# Patient Record
Sex: Female | Born: 1950 | Race: Black or African American | Hispanic: No | Marital: Single | Smoking: Never smoker
Health system: Southern US, Community
[De-identification: ages and names within clinical notes are randomized; demographics above are authoritative.]

## PROBLEM LIST (undated history)

## (undated) DIAGNOSIS — G809 Cerebral palsy, unspecified: Secondary | ICD-10-CM

## (undated) DIAGNOSIS — I639 Cerebral infarction, unspecified: Secondary | ICD-10-CM

---

## 2006-11-26 ENCOUNTER — Ambulatory Visit: Payer: Self-pay | Admitting: Family Medicine

## 2008-08-23 ENCOUNTER — Encounter: Admission: RE | Admit: 2008-08-23 | Discharge: 2008-08-23 | Payer: Self-pay | Admitting: Family Medicine

## 2008-08-27 ENCOUNTER — Emergency Department (HOSPITAL_COMMUNITY): Admission: EM | Admit: 2008-08-27 | Discharge: 2008-08-27 | Payer: Self-pay | Admitting: Emergency Medicine
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
## 2010-06-02 ENCOUNTER — Encounter: Payer: Self-pay | Admitting: Family Medicine

## 2010-06-03 ENCOUNTER — Encounter: Payer: Self-pay | Admitting: Family Medicine

## 2010-08-01 ENCOUNTER — Ambulatory Visit (INDEPENDENT_AMBULATORY_CARE_PROVIDER_SITE_OTHER): Payer: Medicare Other

## 2010-08-01 ENCOUNTER — Inpatient Hospital Stay (INDEPENDENT_AMBULATORY_CARE_PROVIDER_SITE_OTHER)
Admission: RE | Admit: 2010-08-01 | Discharge: 2010-08-01 | Disposition: A | Discharge status: Home / Self Care | Payer: Medicare Other | Source: Ambulatory Visit | Attending: Family Medicine | Admitting: Family Medicine

## 2010-08-01 DIAGNOSIS — S82843A Displaced bimalleolar fracture of unspecified lower leg, initial encounter for closed fracture: Secondary | ICD-10-CM

## 2010-08-20 ENCOUNTER — Ambulatory Visit (HOSPITAL_COMMUNITY): Payer: Medicare Other

## 2010-08-20 ENCOUNTER — Inpatient Hospital Stay (HOSPITAL_COMMUNITY)
Admission: RE | Admit: 2010-08-20 | Discharge: 2010-08-21 | DRG: 493 | Disposition: A | Discharge status: Home / Self Care | Payer: Medicare Other | Source: Ambulatory Visit | Attending: Orthopedic Surgery | Admitting: Orthopedic Surgery

## 2010-08-20 DIAGNOSIS — R4701 Aphasia: Secondary | ICD-10-CM | POA: Diagnosis present

## 2010-08-20 DIAGNOSIS — X58XXXA Exposure to other specified factors, initial encounter: Secondary | ICD-10-CM | POA: Diagnosis present

## 2010-08-20 DIAGNOSIS — IMO0002 Reserved for concepts with insufficient information to code with codable children: Secondary | ICD-10-CM | POA: Diagnosis present

## 2010-08-20 DIAGNOSIS — S82843A Displaced bimalleolar fracture of unspecified lower leg, initial encounter for closed fracture: Principal | ICD-10-CM | POA: Diagnosis present

## 2010-08-20 DIAGNOSIS — F0390 Unspecified dementia without behavioral disturbance: Secondary | ICD-10-CM | POA: Diagnosis present

## 2010-08-20 DIAGNOSIS — Z23 Encounter for immunization: Secondary | ICD-10-CM

## 2010-08-20 DIAGNOSIS — Z7982 Long term (current) use of aspirin: Secondary | ICD-10-CM

## 2010-08-20 DIAGNOSIS — F79 Unspecified intellectual disabilities: Secondary | ICD-10-CM | POA: Diagnosis present

## 2010-08-20 DIAGNOSIS — Z993 Dependence on wheelchair: Secondary | ICD-10-CM

## 2010-08-20 DIAGNOSIS — Z8673 Personal history of transient ischemic attack (TIA), and cerebral infarction without residual deficits: Secondary | ICD-10-CM

## 2010-08-20 DIAGNOSIS — G809 Cerebral palsy, unspecified: Secondary | ICD-10-CM | POA: Diagnosis present

## 2010-08-20 DIAGNOSIS — I739 Peripheral vascular disease, unspecified: Secondary | ICD-10-CM | POA: Diagnosis present

## 2010-08-20 LAB — BASIC METABOLIC PANEL
BUN: 21 mg/dL (ref 6–23)
Creatinine, Ser: 0.75 mg/dL (ref 0.4–1.2)
GFR calc Af Amer: >60 mL/min (ref >60)
GFR calc non Af Amer: >60 mL/min (ref >60)
Potassium: 4.5 mEq/L (ref 3.5–5.1)

## 2010-08-20 LAB — TYPE AND SCREEN: Antibody Screen: NEGATIVE

## 2010-08-20 LAB — CBC
HCT: 44.2 % (ref 36.0–46.0)
Hemoglobin: 14 g/dL (ref 12.0–15.0)
MCH: 28.5 pg (ref 26.0–34.0)
RBC: 4.91 MIL/uL (ref 3.87–5.11)

## 2010-08-20 LAB — PROTIME-INR: Prothrombin Time: 14.2 seconds (ref 11.6–15.2)

## 2010-08-20 LAB — SURGICAL PCR SCREEN: Staphylococcus aureus: NEGATIVE

## 2010-08-21 LAB — CBC
MCH: 28.6 pg (ref 26.0–34.0)
MCV: 91.3 fL (ref 78.0–100.0)
Platelets: 173 10*3/uL (ref 150–400)
RDW: 13 % (ref 11.5–15.5)
WBC: 11.6 10*3/uL — ABNORMAL HIGH (ref 4.0–10.5)

## 2010-08-21 LAB — BASIC METABOLIC PANEL
CO2: 25 mEq/L (ref 19–32)
Glucose, Bld: 122 mg/dL — ABNORMAL HIGH (ref 70–99)

## 2010-08-23 NOTE — Op Note (Signed)
Cheyenne Marsh, Cheyenne Marsh               ACCOUNT NO.:  [id number]  MEDICAL RECORD NO.:  [id number]           PATIENT TYPE:  I  LOCATION:  5005                         FACILITY:  [facility]  PHYSICIAN:  Eulas Post, MD    DATE OF BIRTH:  1950/09/30  DATE OF PROCEDURE:  08/20/2010 DATE OF DISCHARGE:                              OPERATIVE REPORT   ATTENDING SURGEON:  Eulas Post, MD  FIRST ASSISTANT:  Janace Litten, OPA  PREOPERATIVE DIAGNOSIS:  Chronic left bimalleolar ankle fracture- dislocation.  POSTOPERATIVE DIAGNOSIS:  Chronic left bimalleolar ankle fracture- dislocation.  OPERATIVE PROCEDURE:  Open reduction and internal fixation left bimalleolar ankle fracture, malunion.  ANESTHESIA:  General.  ESTIMATED BLOOD LOSS:  Minimal.  TOURNIQUET TIME:  Approximately 90 minutes.  PREOPERATIVE INDICATIONS:  Cheyenne Marsh is a 60 year old woman who has cerebral palsy and has had a stroke who is on Plavix and aspirin who is normally marginally ambulatory, able to transfer and walk around the house with assistance and a walker, who had an ankle injury that was unrecognized for a period of about 4 weeks.  When she was finally brought for evaluation it was determined that she had an ankle fracture dislocation with already evidence for malunion of the distal fibula and medial malleolus.  I had a long discussion with Cheyenne Marsh's sister, who is her primary caretaker, although has never had a power of attorney, regarding the options.  Her sister has been taking care of her, and keeping her alive from the standpoint of daily management for decades, and given that there were no other family involved, I discussed the risks, benefits and alternatives with her.  Ultimately, a two physician consent was required because she has not received legal power of attorney yet, and the court date to appoint this power was not available for another month, which would have greatly complicated and  reduced the likelihood of success for surgical intervention.  Therefore, we proceeded with two physician consent.  The risks, benefits and alternatives were discussed including but not limited to risks of infection, bleeding, nerve injury, recurrent fracture dislocation, malunion, nonunion, hardware prominence, hardware failure, skin breakdown, the potential for amputation, post-traumatic arthritis, inability to regain ambulatory function, cardiopulmonary complications, among others and they were willing to proceed.  OPERATIVE PROCEDURE:  The patient was brought to the operating room and placed in supine position.  IV antibiotics were given.  General anesthesia administered.  Left lower extremity was prepped and draped in usual sterile fashion.  The leg was elevated, exsanguinated and the tourniquet was inflated.  Incision was made over the distal fibula. Dissection was carried down to the fracture site.  There was abundant callus formation and union.  This was taken down with osteotomes and curettes.  Once I had this mobilized, I still could not adequately reduce the fracture, and so I turned my attention to the medial malleolus.  Incision was made over the medial malleolus and dissection carried down to the fracture site.  The fracture was cleaned and hypertrophic callus removed.  Once I had exposed the fracture and mobilize it, then I was able to  reduce the talus back underneath the tibia.  I then maintained the talus under the tibia and reduced the fibula anatomically.  I held it provisionally with a K-wire.  The bone quality was remarkably poor.  This was not good enough to support an interfragmentary lag screw.  Therefore, with a K-wire in place, I applied a Stryker locking plate and secured it proximally with nonlocking screws and distally.  Initially, I placed a nonlocking screw to compress and help push the lateral segment of the fibula more medial, but once I had confirmed  that I had my length and my anatomic reduction achieved.  After I locked the other holes, I changed the nonlocking screw out for a locking screw.  Therefore, I had four locking screws in the distal segment, and three nonlocking screws proximally.  Excellent fixation was achieved, although bone quality was amazingly poor.  I then turned my attention to the medial malleolus.  This was reduced as anatomically as possible.  This was a fairly challenging reduction, and the bone quality was terrible.  Nonetheless, I did place two cannulated screws, although they were fairly close to each other, and I did not feel secure with fixation.  There was still some mobility at the medial segment.  Therefore, I placed a third screw more anteriorly and in a slightly different plane in order to provide additional fixation. Satisfactory fixation was achieved, and I did consider a syndesmotic screw, although there was not a syndesmotic injury based on testing intraoperatively.  Additionally, syndesmotic screw would have been proximal to the fracture line on the fibula, and therefore not provide any additional fixation that the proximal screws had already provided.  I also considered placing a calcaneal Taylor Steinmann pin for extra fixation, however, I was concerned given the patient's mental function, and what will end up being very challenging to limit her weightbearing, that placement of this pin might increase her risk for infection, as well as the fact that she walks on it there could be substantial pin migration, and make the recovery much more challenging, and potentially need for further surgical intervention for hardware removal.  For these reasons, I elected not to place a calcaneal Taylor tibial pin, however, should this operation fail, she will most certainly need a manual type of fixation with or without external fixation.  Nonetheless, I was able to achieve overall restoration of the mortise  and get the talus aligned underneath the tibia and then I irrigated the wounds copiously and repaired the tissue with Vicryl, followed by Monocryl and Steri-Strips and sterile gauze.  She was awakened and the tourniquet released and she returned to the PACU in stable and satisfactory condition.  There were no complications.  She tolerated the procedure well.  Janace Litten, orthopedic PA-C was present and scrubbed throughout the case and critical for completion both with assistance of reduction as well as exposure and closure.  This was an extremely challenging case, with a high risk for failure, but hopefully we will be able to regain some degree of at least standing and pivoting function, if not ambulatory function.     Eulas Post, MD     JPL/MEDQ  D:  08/20/2010  T:  08/21/2010  Job:  [id number]  Electronically Signed by Teryl Lucy MD on 08/23/2010 07:49:10 PM

## 2010-08-23 NOTE — Discharge Summary (Signed)
  [name], [name]               ACCOUNT NO.:  [id number]  MEDICAL RECORD NO.:  [id number]           PATIENT TYPE:  I  LOCATION:  5005                         FACILITY:  [facility]  PHYSICIAN:  Eulas Post, MD    DATE OF BIRTH:  1950-10-26  DATE OF ADMISSION:  08/20/2010 DATE OF DISCHARGE:  08/21/2010                              DISCHARGE SUMMARY   ADMISSION DIAGNOSES: 1. left ankle fracture dislocation malunion. 2. Mental retardation/cerebral palsy.  DISCHARGE DIAGNOSES: 1. left ankle fracture dislocation malunion. 2. Mental retardation/cerebral palsy.  PRIMARY PROCEDURE:  Open reduction and internal fixation left ankle fracture dislocation malunion.  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  She does ambulate however, and had an ankle fracture dislocation that was subacutely diagnosed.  Her caretaker elected to have her undergo open reduction and internal fixation with the goals of regaining ambulatory function.  She tolerated the procedure well and postoperatively did not have any complications.  She continued on her aspirin and Plavix per her routine protocol and we basically operated through this.  She was given sequential compression devices as well as the aspirin and Plavix for blood thinning capacity.  She was working with Physical Therapy and is nonweightbearing on the left lower extremity keeping it elevated while in bed.  She is planned to be discharged home with follow-up with me in 2 weeks.  There was no complication and she benefited maximally from her hospital stay.  She is also being discharged on hydrocodone.     Eulas Post, MD     JPL/MEDQ  D:  08/21/2010  T:  08/21/2010  Job:  [id number]  Electronically Signed by Teryl Lucy MD on 08/23/2010 07:49:13 PM

## 2011-06-05 DIAGNOSIS — M25579 Pain in unspecified ankle and joints of unspecified foot: Secondary | ICD-10-CM | POA: Diagnosis not present

## 2011-06-05 DIAGNOSIS — R413 Other amnesia: Secondary | ICD-10-CM | POA: Diagnosis not present

## 2011-06-05 DIAGNOSIS — I6789 Other cerebrovascular disease: Secondary | ICD-10-CM | POA: Diagnosis not present

## 2011-06-05 DIAGNOSIS — F0150 Vascular dementia without behavioral disturbance: Secondary | ICD-10-CM | POA: Diagnosis not present

## 2011-12-30 DIAGNOSIS — I6789 Other cerebrovascular disease: Secondary | ICD-10-CM | POA: Diagnosis not present

## 2011-12-30 DIAGNOSIS — F0150 Vascular dementia without behavioral disturbance: Secondary | ICD-10-CM | POA: Diagnosis not present

## 2012-03-22 IMAGING — CR DG CHEST 1V
1 series · 1 of 1 positions shown · non-contrast
Comparison: None.

CLINICAL DATA: Cough, hypertension, CAD.

CHEST - 1 VIEW

[view not recorded]
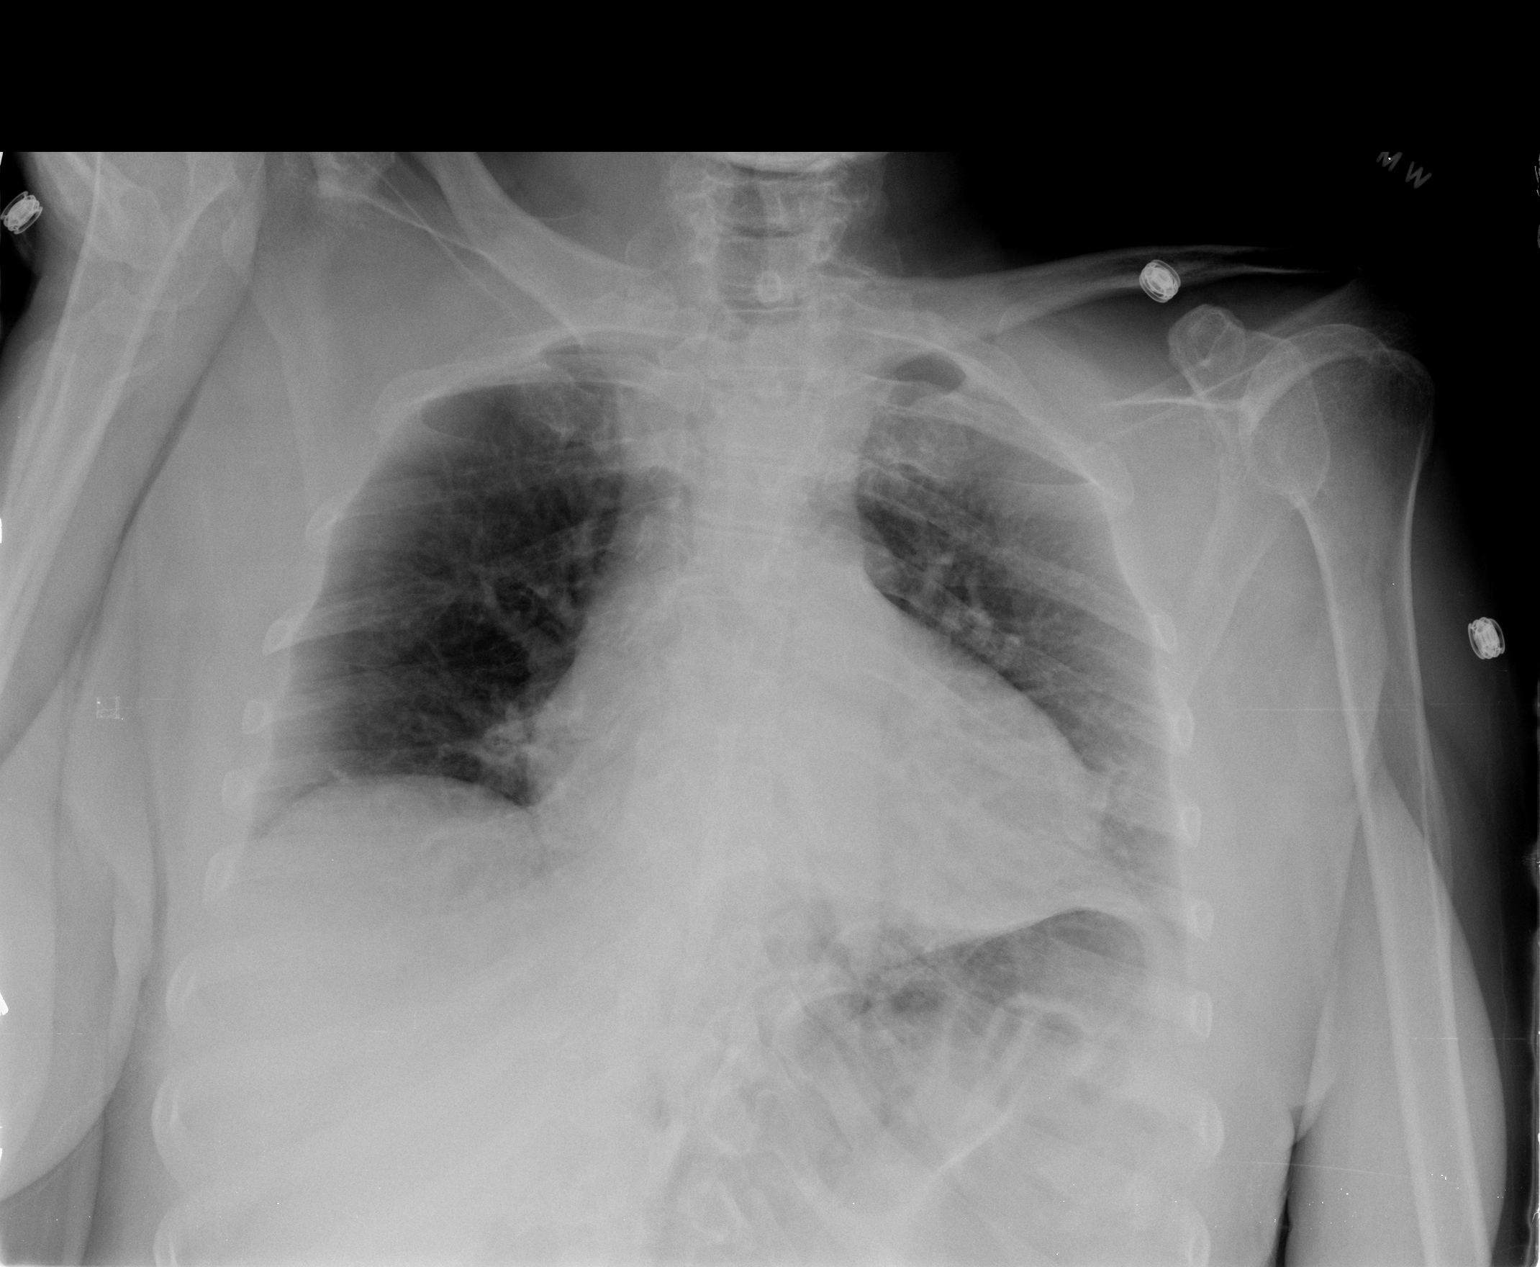

[1 of 1 positions shown; findings below may reference images not displayed]

FINDINGS: Low lung volumes.  Left basilar atelectasis. No pleural
effusion or pneumothorax.

Mild cardiomegaly.

Visualized osseous structures are within normal limits.
IMPRESSION: Mild cardiomegaly.

## 2012-03-22 IMAGING — RF DG ANKLE 2V *L*
1 series · 2 of 2 positions shown · non-contrast
Comparison: 08/01/2010

CLINICAL DATA: ORIF left ankle.

LEFT ANKLE - 2 VIEW

[Series 1: run · 2 of 2 slices shown]
[im 1/2]
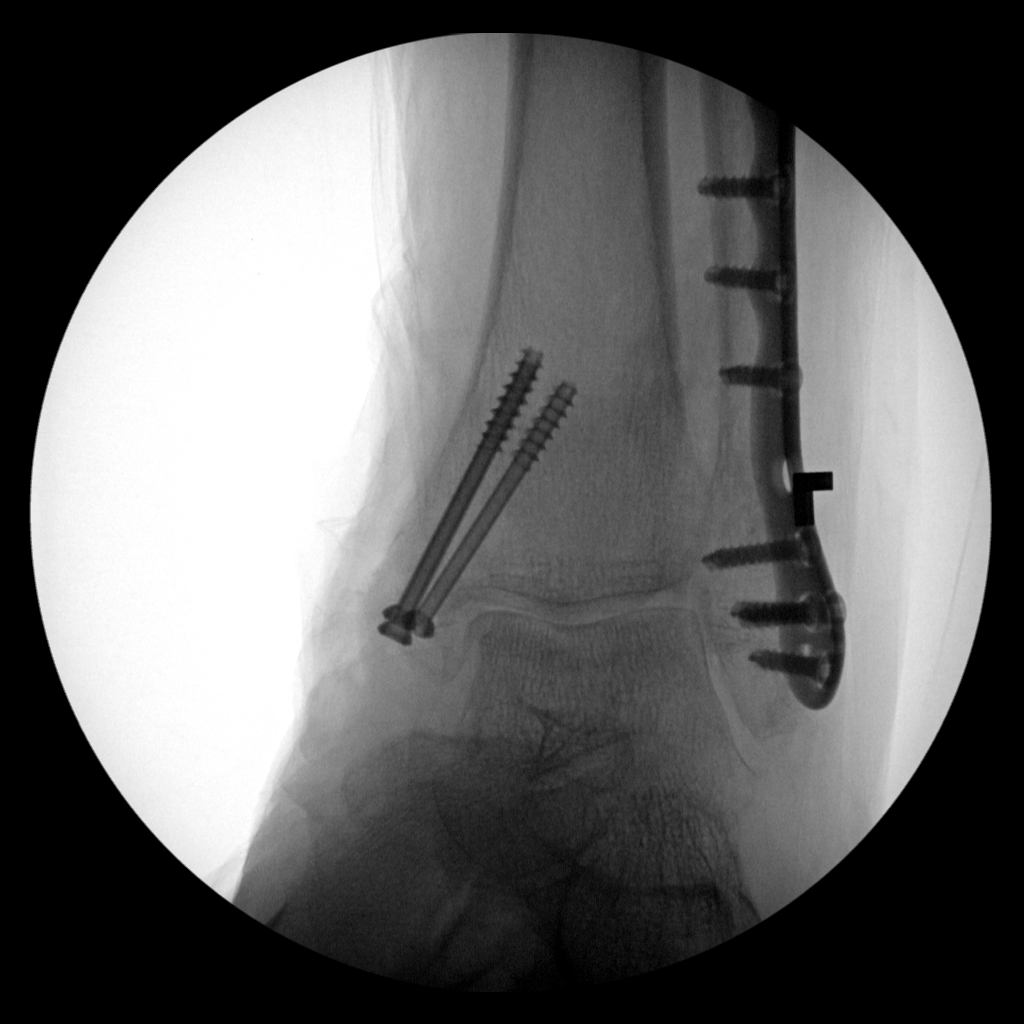
[im 2/2]
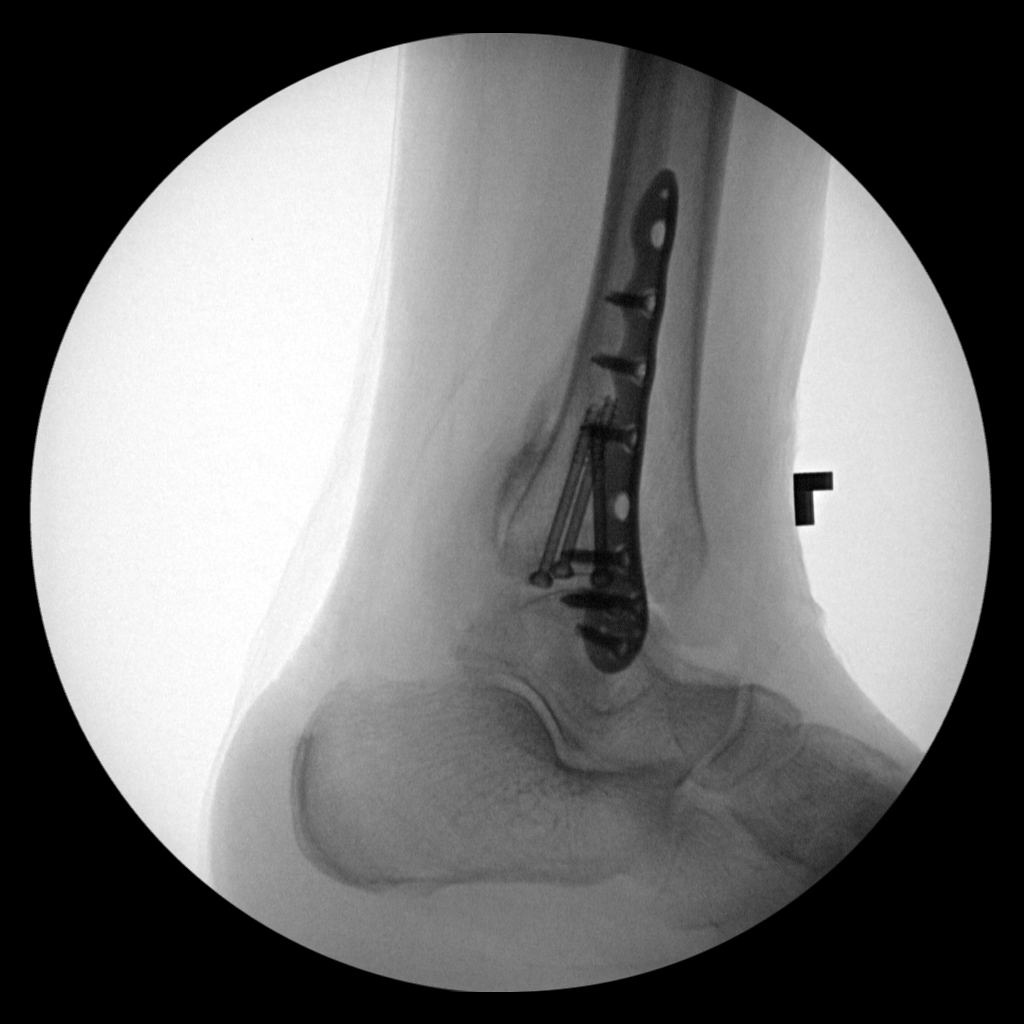

[2 of 2 positions shown; findings below may reference images not displayed]

FINDINGS: Intraoperative fluoroscopic spot views of the left ankle
are submitted.  Three screws traverse a fracture of the medial
malleolus.  Lateral plate and screws traverse a fracture of the
lateral malleolus.  Ankle mortise appears in better anatomic
alignment.  Osseous detail is degraded by technique.
IMPRESSION: Intraoperative visualization of ORIF left ankle-fracture
dislocation.

## 2012-07-22 DIAGNOSIS — I6789 Other cerebrovascular disease: Secondary | ICD-10-CM | POA: Diagnosis not present

## 2012-07-22 DIAGNOSIS — F0150 Vascular dementia without behavioral disturbance: Secondary | ICD-10-CM | POA: Diagnosis not present

## 2012-07-22 DIAGNOSIS — J309 Allergic rhinitis, unspecified: Secondary | ICD-10-CM | POA: Diagnosis not present

## 2013-01-05 ENCOUNTER — Other Ambulatory Visit: Payer: Self-pay | Admitting: Family Medicine

## 2013-01-05 DIAGNOSIS — F0150 Vascular dementia without behavioral disturbance: Secondary | ICD-10-CM | POA: Diagnosis not present

## 2013-01-05 DIAGNOSIS — R5383 Other fatigue: Secondary | ICD-10-CM | POA: Diagnosis not present

## 2013-01-05 DIAGNOSIS — M25579 Pain in unspecified ankle and joints of unspecified foot: Secondary | ICD-10-CM | POA: Diagnosis not present

## 2013-01-05 DIAGNOSIS — R928 Other abnormal and inconclusive findings on diagnostic imaging of breast: Secondary | ICD-10-CM

## 2013-01-05 DIAGNOSIS — I6789 Other cerebrovascular disease: Secondary | ICD-10-CM | POA: Diagnosis not present

## 2013-01-05 DIAGNOSIS — R5381 Other malaise: Secondary | ICD-10-CM | POA: Diagnosis not present

## 2013-01-26 ENCOUNTER — Other Ambulatory Visit: Payer: Medicare Other

## 2013-02-09 ENCOUNTER — Other Ambulatory Visit: Payer: Medicare Other

## 2013-06-17 ENCOUNTER — Emergency Department (INDEPENDENT_AMBULATORY_CARE_PROVIDER_SITE_OTHER): Payer: Medicare Other

## 2013-06-17 ENCOUNTER — Encounter (HOSPITAL_COMMUNITY): Payer: Self-pay | Admitting: Emergency Medicine

## 2013-06-17 ENCOUNTER — Emergency Department (INDEPENDENT_AMBULATORY_CARE_PROVIDER_SITE_OTHER)
Admission: EM | Admit: 2013-06-17 | Discharge: 2013-06-17 | Disposition: A | Discharge status: Home / Self Care | Payer: Medicare Other | Source: Home / Self Care

## 2013-06-17 DIAGNOSIS — R5383 Other fatigue: Secondary | ICD-10-CM

## 2013-06-17 DIAGNOSIS — R531 Weakness: Secondary | ICD-10-CM

## 2013-06-17 DIAGNOSIS — S298XXA Other specified injuries of thorax, initial encounter: Secondary | ICD-10-CM | POA: Diagnosis not present

## 2013-06-17 DIAGNOSIS — R5381 Other malaise: Secondary | ICD-10-CM

## 2013-06-17 HISTORY — DX: Cerebral infarction, unspecified: I63.9

## 2013-06-17 HISTORY — DX: Cerebral palsy, unspecified: G80.9

## 2013-06-17 LAB — POCT URINALYSIS DIP (DEVICE)
Bilirubin Urine: NEGATIVE
Glucose, UA: NEGATIVE mg/dL
Ketones, ur: NEGATIVE mg/dL
Nitrite: NEGATIVE
PH: 7 (ref 5.0–8.0)
PROTEIN: NEGATIVE mg/dL
SPECIFIC GRAVITY, URINE: 1.025 (ref 1.005–1.030)
Urobilinogen, UA: 4 mg/dL — ABNORMAL HIGH (ref 0.0–1.0)

## 2013-06-17 LAB — POCT I-STAT, CHEM 8
BUN: 18 mg/dL (ref 6–23)
CHLORIDE: 110 meq/L (ref 96–112)
CREATININE: 0.8 mg/dL (ref 0.50–1.10)
Calcium, Ion: 1.27 mmol/L (ref 1.13–1.30)
GLUCOSE: 105 mg/dL — AB (ref 70–99)
HCT: 41 % (ref 36.0–46.0)
Hemoglobin: 13.9 g/dL (ref 12.0–15.0)
POTASSIUM: 3.7 meq/L (ref 3.7–5.3)
Sodium: 148 mEq/L — ABNORMAL HIGH (ref 137–147)
TCO2: 26 mmol/L (ref 0–100)

## 2013-06-17 NOTE — ED Notes (Signed)
Falls every day; more clumsy past 2 weeks. Concern for new stroke event in past 2 weeks. Muscle tone has left her

## 2013-06-17 NOTE — Discharge Instructions (Signed)
° ° °  No findings today to explain weakness. Would recommend f/u with PCP next week for further testing. Good Luck. Certainly if something worsens go to the ER.

## 2013-06-17 NOTE — ED Provider Notes (Signed)
Medical screening examination/treatment/procedure(s) were performed by non-physician practitioner and as supervising physician I was immediately available for consultation/collaboration.  Jennalee Greaves, M.D.   Makaleigh Reinard C Kris No, MD 06/17/13 2107 

## 2013-06-17 NOTE — ED Provider Notes (Signed)
CSN: [id number]     Arrival date & time 06/17/13  1636 History   None    Chief Complaint  Patient presents with  . Fall   (Consider location/radiation/quality/duration/timing/severity/associated sxs/prior Treatment) HPI Comments: History provided by patient's sister as patient has Cerebral Palsy and cannot communicate by words. She does follow commands. Sister reports a 1 week history of generalized weakness when standing or attempting to walk. For this reason she has noted falls. She is concerned for a possible infection, as patient has been "wheezing". She reports no signs of fever or cough. Has not noted any change in her urine. Eating and drinking normally.   Patient is a 63 y.o. female presenting with fall. The history is provided by a caregiver and a relative.  Fall    Past Medical History  Diagnosis Date  . Cerebral palsy   . Stroke    History reviewed. No pertinent past surgical history. History reviewed. No pertinent family history. History  Substance Use Topics  . Smoking status: Never Smoker   . Smokeless tobacco: Not on file  . Alcohol Use: No   OB History   Grav Para Term Preterm Abortions TAB SAB Ect Mult Living                 Review of Systems  Unable to perform ROS   Allergies  Review of patient's allergies indicates no known allergies.  Home Medications   Current Outpatient Rx  Name  Route  Sig  Dispense  Refill  . clopidogrel (PLAVIX) 75 MG tablet   Oral   Take 75 mg by mouth daily with breakfast.         . donepezil (ARICEPT) 5 MG tablet   Oral   Take 5 mg by mouth at bedtime.         Marland Kitchen. thioridazine (MELLARIL) 50 MG tablet   Oral   Take 50 mg by mouth 3 (three) times daily.          BP 149/78  Pulse 76  Temp(Src) 99.6 F (37.6 C) (Oral)  Resp 20  SpO2 99% Physical Exam  Nursing note and vitals reviewed. Constitutional: She appears well-developed and well-nourished. No distress.  Patient smiling, able to follow commands, cannot  speak, in wheelchair with arms contracted.   HENT:  Head: Normocephalic and atraumatic.  Missing teeth, no oral injection  Neck: Normal range of motion. Neck supple.  Cardiovascular: Normal rate, regular rhythm and normal heart sounds.   Pulmonary/Chest: Effort normal. No respiratory distress. She has wheezes. She has no rales. She exhibits no tenderness.  Abdominal: Soft. Bowel sounds are normal.  Musculoskeletal:  ROM limited at baseline as both arms are contracted. Follows commands in both upper and lower extremities without difficulty. No abrasions or ecchymosis are noted  Neurological: She is alert. She displays normal reflexes. No cranial nerve deficit. She exhibits normal muscle tone. Coordination normal.  Grossly Neuro exam intact. Good proximal strength in upper and lower extremities.  Skin: Skin is warm and dry. No rash noted. She is not diaphoretic. No erythema.    ED Course  Procedures (including critical care time) Labs Review Labs Reviewed  POCT I-STAT, CHEM 8 - Abnormal; Notable for the following:    Sodium 148 (*)    Glucose, Bld 105 (*)    All other components within normal limits  POCT URINALYSIS DIP (DEVICE) - Abnormal; Notable for the following:    Hgb urine dipstick TRACE (*)    Urobilinogen, UA 4.0 (*)  Leukocytes, UA SMALL (*)    All other components within normal limits   Imaging Review Dg Chest 2 View  06/17/2013   CLINICAL DATA:  Multiple falls.  EXAM: CHEST  2 VIEW  COMPARISON:  08/20/2010.  FINDINGS: Stable borderline enlarged cardiac silhouette clear lungs. No fracture or pneumothorax seen.  IMPRESSION: No acute abnormality.   Electronically Signed   By: Gordan Payment M.D.   On: 06/17/2013 18:48      MDM   1. Weakness    Etiology unclear, Neuro exam intact. No obvious signs of infection. Patient appears well and follows commands. Suggest f/u with  PCP next week for further treatment.     Riki Sheer, PA-C 06/17/13 1933

## 2013-08-18 DIAGNOSIS — M25579 Pain in unspecified ankle and joints of unspecified foot: Secondary | ICD-10-CM | POA: Diagnosis not present

## 2013-08-18 DIAGNOSIS — I6789 Other cerebrovascular disease: Secondary | ICD-10-CM | POA: Diagnosis not present

## 2013-08-18 DIAGNOSIS — R413 Other amnesia: Secondary | ICD-10-CM | POA: Diagnosis not present

## 2014-07-31 DIAGNOSIS — Z79899 Other long term (current) drug therapy: Secondary | ICD-10-CM | POA: Diagnosis not present

## 2014-07-31 DIAGNOSIS — I6789 Other cerebrovascular disease: Secondary | ICD-10-CM | POA: Diagnosis not present

## 2014-07-31 DIAGNOSIS — G809 Cerebral palsy, unspecified: Secondary | ICD-10-CM | POA: Diagnosis not present

## 2014-07-31 DIAGNOSIS — Z136 Encounter for screening for cardiovascular disorders: Secondary | ICD-10-CM | POA: Diagnosis not present

## 2015-02-01 DIAGNOSIS — G809 Cerebral palsy, unspecified: Secondary | ICD-10-CM | POA: Diagnosis not present

## 2015-10-11 DIAGNOSIS — I6789 Other cerebrovascular disease: Secondary | ICD-10-CM | POA: Diagnosis not present

## 2015-10-11 DIAGNOSIS — Z79899 Other long term (current) drug therapy: Secondary | ICD-10-CM | POA: Diagnosis not present

## 2015-10-11 DIAGNOSIS — F0390 Unspecified dementia without behavioral disturbance: Secondary | ICD-10-CM | POA: Diagnosis not present

## 2015-10-11 DIAGNOSIS — G809 Cerebral palsy, unspecified: Secondary | ICD-10-CM | POA: Diagnosis not present

## 2015-12-06 ENCOUNTER — Emergency Department (HOSPITAL_BASED_OUTPATIENT_CLINIC_OR_DEPARTMENT_OTHER): Payer: Medicare Other

## 2015-12-06 ENCOUNTER — Emergency Department (HOSPITAL_BASED_OUTPATIENT_CLINIC_OR_DEPARTMENT_OTHER)
Admission: EM | Admit: 2015-12-06 | Discharge: 2015-12-06 | Disposition: A | Discharge status: Home / Self Care | Payer: Medicare Other | Attending: Emergency Medicine | Admitting: Emergency Medicine

## 2015-12-06 DIAGNOSIS — M7989 Other specified soft tissue disorders: Secondary | ICD-10-CM | POA: Diagnosis not present

## 2015-12-06 DIAGNOSIS — F0390 Unspecified dementia without behavioral disturbance: Secondary | ICD-10-CM | POA: Diagnosis not present

## 2015-12-06 DIAGNOSIS — Z8673 Personal history of transient ischemic attack (TIA), and cerebral infarction without residual deficits: Secondary | ICD-10-CM | POA: Insufficient documentation

## 2015-12-06 DIAGNOSIS — R600 Localized edema: Secondary | ICD-10-CM | POA: Diagnosis not present

## 2015-12-06 DIAGNOSIS — G809 Cerebral palsy, unspecified: Secondary | ICD-10-CM | POA: Diagnosis not present

## 2015-12-06 LAB — CBC WITH DIFFERENTIAL/PLATELET
BASOS PCT: 0 %
Basophils Absolute: 0 10*3/uL (ref 0.0–0.1)
EOS PCT: 6 %
Eosinophils Absolute: 0.4 10*3/uL (ref 0.0–0.7)
HCT: 34.2 % — ABNORMAL LOW (ref 36.0–46.0)
Hemoglobin: 10.6 g/dL — ABNORMAL LOW (ref 12.0–15.0)
Lymphocytes Relative: 21 %
Lymphs Abs: 1.4 10*3/uL (ref 0.7–4.0)
MCH: 29.4 pg (ref 26.0–34.0)
MCHC: 31 g/dL (ref 30.0–36.0)
MCV: 94.7 fL (ref 78.0–100.0)
MONO ABS: 0.6 10*3/uL (ref 0.1–1.0)
Monocytes Relative: 9 %
NEUTROS PCT: 64 %
Neutro Abs: 4.1 10*3/uL (ref 1.7–7.7)
PLATELETS: 278 10*3/uL (ref 150–400)
RBC: 3.61 MIL/uL — ABNORMAL LOW (ref 3.87–5.11)
RDW: 12.4 % (ref 11.5–15.5)
WBC: 6.5 10*3/uL (ref 4.0–10.5)

## 2015-12-06 LAB — COMPREHENSIVE METABOLIC PANEL
ALT: 10 U/L — ABNORMAL LOW (ref 14–54)
AST: 16 U/L (ref 15–41)
Albumin: 3.6 g/dL (ref 3.5–5.0)
Alkaline Phosphatase: 70 U/L (ref 38–126)
Anion gap: 6 (ref 5–15)
BUN: 13 mg/dL (ref 6–20)
CALCIUM: 9.4 mg/dL (ref 8.9–10.3)
CO2: 31 mmol/L (ref 22–32)
Chloride: 108 mmol/L (ref 101–111)
Creatinine, Ser: 0.59 mg/dL (ref 0.44–1.00)
GFR calc non Af Amer: >60 mL/min (ref >60)
Glucose, Bld: 102 mg/dL — ABNORMAL HIGH (ref 65–99)
Potassium: 3.7 mmol/L (ref 3.5–5.1)
SODIUM: 145 mmol/L (ref 135–145)
Total Bilirubin: 0.4 mg/dL (ref 0.3–1.2)
Total Protein: 7.7 g/dL (ref 6.5–8.1)

## 2015-12-06 NOTE — ED Triage Notes (Signed)
She was sent from [facility] with swelling in her right leg from her thigh down to her foot.

## 2015-12-06 NOTE — ED Provider Notes (Signed)
[facility] Provider Note   CSN: [id number] Arrival date & time: 12/06/15  1648  First Provider Contact:  First MD Initiated Contact with Patient 12/06/15 1805     By signing my name below, I, Soijett Blue, attest that this documentation has been prepared under the direction and in the presence of Melene Plan, DO. Electronically Signed: Soijett Blue, ED Scribe. 12/06/15. 6:14 PM.    History   Chief Complaint Chief Complaint  Patient presents with  . Leg Swelling   LEVEL 5 CAVEAT: PT NONVERBAL  HPI Cheyenne Marsh is a 65 y.o. female with a medical hx of cerebral palsy who presents to the Emergency Department complaining of right leg swelling onset 1 week. Pt relative states that the pt was sent from [facility] for further evaluation of the pt right leg swelling. Pt relative reports that the pt right leg swelling has mildly decreased since its initial onset. Pt relative denies the pt suffering any injury or trauma to the affected area. Pt relative has not given the pt any medications for the relief of her symptoms.  Pt relative denies the pt having color change, wound, SOB, and any other symptoms. Pt relative denies the pt having any past issues with her kidney at this time.   The history is provided by a relative. No language interpreter was used.    Past Medical History:  Diagnosis Date  . Cerebral palsy   . Stroke     There are no active problems to display for this patient.   No past surgical history on file.  OB History    No data available       Home Medications    Prior to Admission medications   Medication Sig Start Date End Date Taking? Authorizing Provider  clopidogrel (PLAVIX) 75 MG tablet Take 75 mg by mouth daily with breakfast.    Historical Provider, MD  donepezil (ARICEPT) 5 MG tablet Take 5 mg by mouth at bedtime.    Historical Provider, MD  thioridazine (MELLARIL) 50 MG tablet Take 50 mg by mouth 3 (three) times daily.     Historical Provider, MD    Family History No family history on file.  Social History Social History  Substance Use Topics  . Smoking status: Never Smoker  . Smokeless tobacco: Not on file  . Alcohol use No     Allergies   Review of patient's allergies indicates no known allergies.   Review of Systems Review of Systems  Unable to perform ROS: Patient nonverbal     Physical Exam Updated Vital Signs BP 125/75 (BP Location: Left Arm)   Pulse 87   Temp 98.5 F (36.9 C) (Oral)   Resp 18   Ht 5\' 4"  (1.626 m)   Wt 130 lb (59 kg)   SpO2 97%   BMI 22.31 kg/m   Physical Exam  Constitutional: She is oriented to person, place, and time. She appears well-developed and well-nourished. No distress.  Pt non-verbal.   HENT:  Head: Normocephalic and atraumatic.  Eyes: EOM are normal. Pupils are equal, round, and reactive to light.  Neck: Normal range of motion. Neck supple.  Cardiovascular: Normal rate and regular rhythm.  Exam reveals no gallop and no friction rub.   No murmur heard. Pulmonary/Chest: Effort normal. She has no wheezes. She has no rales.  Abdominal: Soft. She exhibits no distension. There is no tenderness.  Musculoskeletal: She exhibits edema. She exhibits no tenderness.  Right lower leg: She exhibits edema.       Left lower leg: She exhibits no edema.  Catractures of upper extremities. RLE edema > LLE from thigh down. No noted erythema. No wounds.   Neurological: She is alert and oriented to person, place, and time.  Skin: Skin is warm and dry. She is not diaphoretic.  Psychiatric: She has a normal mood and affect. Her behavior is normal.  Nursing note and vitals reviewed.    ED Treatments / Results  DIAGNOSTIC STUDIES: Oxygen Saturation is 98% on RA, nl by my interpretation.    COORDINATION OF CARE: 6:14 PM Discussed treatment plan with pt relative at bedside which includes EKG, US venous, and pt relative agreed to plan.   Labs (all labs  ordered are listed, but only abnormal results are displayed) Labs Reviewed  COMPREHENSIVE METABOLIC PANEL - Abnormal; Notable for the following:       Result Value   Glucose, Bld 102 (*)    ALT 10 (*)    All other components within normal limits  CBC WITH DIFFERENTIAL/PLATELET - Abnormal; Notable for the following:    RBC 3.61 (*)    Hemoglobin 10.6 (*)    HCT 34.2 (*)    All other components within normal limits    EKG  EKG Interpretation  Date/Time:  Thursday December 06 2015 18:18:57 EDT Ventricular Rate:  86 PR Interval:    QRS Duration: 100 QT Interval:  428 QTC Calculation: 512 R Axis:   20 Text Interpretation:  Sinus rhythm Probable left ventricular hypertrophy Nonspecific T abnrm, anterolateral leads Prolonged QT interval No significant change since last tracing Confirmed by Stormy Connon MD, DANIEL [phone]) on 12/06/2015 7:16:53 PM       Radiology US Venous Img Lower Unilateral Right  Result Date: 12/06/2015 CLINICAL DATA:  Right thigh swelling for the past week. Evaluate for DVT. EXAM: RIGHT LOWER EXTREMITY VENOUS DOPPLER ULTRASOUND TECHNIQUE: Gray-scale sonography with graded compression, as well as color Doppler and duplex ultrasound were performed to evaluate the lower extremity deep venous systems from the level of the common femoral vein and including the common femoral, femoral, profunda femoral, popliteal and calf veins including the posterior tibial, peroneal and gastrocnemius veins when visible. The superficial great saphenous vein was also interrogated. Spectral Doppler was utilized to evaluate flow at rest and with distal augmentation maneuvers in the common femoral, femoral and popliteal veins. COMPARISON:  None. FINDINGS: Contralateral Common Femoral Vein: Respiratory phasicity is normal and symmetric with the symptomatic side. No evidence of thrombus. Normal compressibility. Common Femoral Vein: No evidence of thrombus. Normal compressibility, respiratory phasicity and  response to augmentation. These findings were confirmed following discussion with the performing sonographer by the dictating radiologist. Saphenofemoral Junction: No evidence of thrombus. Normal compressibility and flow on color Doppler imaging. Profunda Femoral Vein: No evidence of thrombus. Normal compressibility and flow on color Doppler imaging. Femoral Vein: No evidence of thrombus. Normal compressibility, respiratory phasicity and response to augmentation. Popliteal Vein: No evidence of thrombus. Normal compressibility, respiratory phasicity and response to augmentation. Calf Veins: No evidence of thrombus. Normal compressibility and flow on color Doppler imaging. Superficial Great Saphenous Vein: No evidence of thrombus. Normal compressibility and flow on color Doppler imaging. Venous Reflux:  None. Other Findings:  None. IMPRESSION: No definite evidence of DVT within the right lower extremity. Electronically Signed   By: Simonne Come M.D.   On: 12/06/2015 19:28   Procedures Procedures (including critical care time)  Medications Ordered in ED  Medications - No data to display   Initial Impression / Assessment and Plan / ED Course  I have reviewed the triage vital signs and the nursing notes.  Pertinent labs & imaging results that were available during my care of the patient were reviewed by me and considered in my medical decision making (see chart for details).  Clinical Course    65 yo F with R leg swelling.  Full leg, no noted injury, no redness. DVT study negative.  Leg is markedly different size, I recommended to the family likely repeat US in 1-2 weeks.   10:21 PM:  I have discussed the diagnosis/risks/treatment options with the patient and family and believe the pt to be eligible for discharge home to follow-up with PCP. We also discussed returning to the ED immediately if new or worsening sx occur. We discussed the sx which are most concerning (e.g., chest pain, sob, syncope) that  necessitate immediate return. Medications administered to the patient during their visit and any new prescriptions provided to the patient are listed below.  Medications given during this visit Medications - No data to display   The patient appears reasonably screen and/or stabilized for discharge and I doubt any other medical condition or other [facility] requiring further screening, evaluation, or treatment in the ED at this time prior to discharge.    Final Clinical Impressions(s) / ED Diagnoses   Final diagnoses:  Right leg swelling    New Prescriptions Discharge Medication List as of 12/06/2015  7:53 PM       I personally performed the services described in this documentation, which was scribed in my presence. The recorded information has been reviewed and is accurate.     Melene Plan, DO 12/06/15 2221

## 2015-12-06 NOTE — Discharge Instructions (Signed)
Follow up with her family doctor. You may need a repeat ultrasound in a week.

## 2016-01-09 DIAGNOSIS — E46 Unspecified protein-calorie malnutrition: Secondary | ICD-10-CM | POA: Diagnosis not present

## 2016-01-09 DIAGNOSIS — R600 Localized edema: Secondary | ICD-10-CM | POA: Diagnosis not present

## 2016-01-09 DIAGNOSIS — R634 Abnormal weight loss: Secondary | ICD-10-CM | POA: Diagnosis not present

## 2016-10-14 DIAGNOSIS — Z79899 Other long term (current) drug therapy: Secondary | ICD-10-CM | POA: Diagnosis not present

## 2016-10-14 DIAGNOSIS — G809 Cerebral palsy, unspecified: Secondary | ICD-10-CM | POA: Diagnosis not present

## 2016-10-14 DIAGNOSIS — F0390 Unspecified dementia without behavioral disturbance: Secondary | ICD-10-CM | POA: Diagnosis not present

## 2016-10-14 DIAGNOSIS — Z23 Encounter for immunization: Secondary | ICD-10-CM | POA: Diagnosis not present

## 2016-10-14 DIAGNOSIS — I6789 Other cerebrovascular disease: Secondary | ICD-10-CM | POA: Diagnosis not present

## 2016-10-14 DIAGNOSIS — E559 Vitamin D deficiency, unspecified: Secondary | ICD-10-CM | POA: Diagnosis not present

## 2016-11-08 ENCOUNTER — Encounter (HOSPITAL_COMMUNITY): Payer: Self-pay | Admitting: Emergency Medicine

## 2016-11-08 ENCOUNTER — Emergency Department (HOSPITAL_COMMUNITY): Payer: Medicare Other

## 2016-11-08 ENCOUNTER — Inpatient Hospital Stay (HOSPITAL_COMMUNITY): Payer: Medicare Other

## 2016-11-08 ENCOUNTER — Inpatient Hospital Stay (HOSPITAL_COMMUNITY)
Admission: EM | Admit: 2016-11-08 | Discharge: 2016-11-13 | DRG: 871 | Disposition: A | Discharge status: Home / Self Care | Payer: Medicare Other | Attending: Internal Medicine | Admitting: Internal Medicine

## 2016-11-08 DIAGNOSIS — Z7902 Long term (current) use of antithrombotics/antiplatelets: Secondary | ICD-10-CM | POA: Diagnosis not present

## 2016-11-08 DIAGNOSIS — G809 Cerebral palsy, unspecified: Secondary | ICD-10-CM

## 2016-11-08 DIAGNOSIS — R937 Abnormal findings on diagnostic imaging of other parts of musculoskeletal system: Secondary | ICD-10-CM | POA: Diagnosis not present

## 2016-11-08 DIAGNOSIS — Z7189 Other specified counseling: Secondary | ICD-10-CM | POA: Diagnosis not present

## 2016-11-08 DIAGNOSIS — E860 Dehydration: Secondary | ICD-10-CM

## 2016-11-08 DIAGNOSIS — I679 Cerebrovascular disease, unspecified: Secondary | ICD-10-CM | POA: Diagnosis not present

## 2016-11-08 DIAGNOSIS — G9520 Unspecified cord compression: Secondary | ICD-10-CM

## 2016-11-08 DIAGNOSIS — Z79899 Other long term (current) drug therapy: Secondary | ICD-10-CM

## 2016-11-08 DIAGNOSIS — M4802 Spinal stenosis, cervical region: Secondary | ICD-10-CM | POA: Diagnosis present

## 2016-11-08 DIAGNOSIS — J479 Bronchiectasis, uncomplicated: Secondary | ICD-10-CM | POA: Diagnosis not present

## 2016-11-08 DIAGNOSIS — R571 Hypovolemic shock: Secondary | ICD-10-CM

## 2016-11-08 DIAGNOSIS — R532 Functional quadriplegia: Secondary | ICD-10-CM | POA: Diagnosis present

## 2016-11-08 DIAGNOSIS — N390 Urinary tract infection, site not specified: Secondary | ICD-10-CM | POA: Diagnosis present

## 2016-11-08 DIAGNOSIS — R6520 Severe sepsis without septic shock: Secondary | ICD-10-CM | POA: Diagnosis present

## 2016-11-08 DIAGNOSIS — R638 Other symptoms and signs concerning food and fluid intake: Secondary | ICD-10-CM | POA: Diagnosis not present

## 2016-11-08 DIAGNOSIS — E870 Hyperosmolality and hypernatremia: Secondary | ICD-10-CM | POA: Diagnosis present

## 2016-11-08 DIAGNOSIS — N179 Acute kidney failure, unspecified: Secondary | ICD-10-CM | POA: Diagnosis not present

## 2016-11-08 DIAGNOSIS — R627 Adult failure to thrive: Secondary | ICD-10-CM | POA: Diagnosis not present

## 2016-11-08 DIAGNOSIS — R296 Repeated falls: Secondary | ICD-10-CM | POA: Diagnosis present

## 2016-11-08 DIAGNOSIS — M6281 Muscle weakness (generalized): Secondary | ICD-10-CM

## 2016-11-08 DIAGNOSIS — Z515 Encounter for palliative care: Secondary | ICD-10-CM

## 2016-11-08 DIAGNOSIS — G808 Other cerebral palsy: Secondary | ICD-10-CM | POA: Diagnosis not present

## 2016-11-08 DIAGNOSIS — D649 Anemia, unspecified: Secondary | ICD-10-CM | POA: Diagnosis present

## 2016-11-08 DIAGNOSIS — R4182 Altered mental status, unspecified: Secondary | ICD-10-CM | POA: Diagnosis not present

## 2016-11-08 DIAGNOSIS — L8990 Pressure ulcer of unspecified site, unspecified stage: Secondary | ICD-10-CM | POA: Diagnosis present

## 2016-11-08 DIAGNOSIS — Z8673 Personal history of transient ischemic attack (TIA), and cerebral infarction without residual deficits: Secondary | ICD-10-CM

## 2016-11-08 DIAGNOSIS — N3001 Acute cystitis with hematuria: Secondary | ICD-10-CM | POA: Diagnosis not present

## 2016-11-08 DIAGNOSIS — R402421 Glasgow coma scale score 9-12, in the field [EMT or ambulance]: Secondary | ICD-10-CM | POA: Diagnosis not present

## 2016-11-08 DIAGNOSIS — A419 Sepsis, unspecified organism: Secondary | ICD-10-CM | POA: Diagnosis not present

## 2016-11-08 DIAGNOSIS — M4800 Spinal stenosis, site unspecified: Secondary | ICD-10-CM | POA: Diagnosis not present

## 2016-11-08 DIAGNOSIS — Z66 Do not resuscitate: Secondary | ICD-10-CM | POA: Diagnosis present

## 2016-11-08 DIAGNOSIS — K5900 Constipation, unspecified: Secondary | ICD-10-CM | POA: Diagnosis present

## 2016-11-08 LAB — COMPREHENSIVE METABOLIC PANEL
ALT: 19 U/L (ref 14–54)
AST: 24 U/L (ref 15–41)
Albumin: 3.3 g/dL — ABNORMAL LOW (ref 3.5–5.0)
Alkaline Phosphatase: 65 U/L (ref 38–126)
Anion gap: 11 (ref 5–15)
BILIRUBIN TOTAL: 0.4 mg/dL (ref 0.3–1.2)
BUN: 48 mg/dL — AB (ref 6–20)
CO2: 23 mmol/L (ref 22–32)
CREATININE: 2.38 mg/dL — AB (ref 0.44–1.00)
Calcium: 9.5 mg/dL (ref 8.9–10.3)
Chloride: 122 mmol/L — ABNORMAL HIGH (ref 101–111)
GFR calc Af Amer: 24 mL/min — ABNORMAL LOW (ref >60)
GFR, EST NON AFRICAN AMERICAN: 20 mL/min — AB (ref >60)
GLUCOSE: 152 mg/dL — AB (ref 65–99)
Potassium: 5 mmol/L (ref 3.5–5.1)
Sodium: 156 mmol/L — ABNORMAL HIGH (ref 135–145)
TOTAL PROTEIN: 7.4 g/dL (ref 6.5–8.1)

## 2016-11-08 LAB — I-STAT CG4 LACTIC ACID, ED
LACTIC ACID, VENOUS: 3.04 mmol/L — AB (ref 0.5–1.9)
Lactic Acid, Venous: 3.63 mmol/L (ref 0.5–1.9)

## 2016-11-08 LAB — CBC WITH DIFFERENTIAL/PLATELET
BASOS ABS: 0 10*3/uL (ref 0.0–0.1)
Basophils Relative: 0 %
EOS ABS: 0 10*3/uL (ref 0.0–0.7)
EOS PCT: 0 %
HCT: 35.4 % — ABNORMAL LOW (ref 36.0–46.0)
Hemoglobin: 10.8 g/dL — ABNORMAL LOW (ref 12.0–15.0)
Lymphocytes Relative: 10 %
Lymphs Abs: 1.7 10*3/uL (ref 0.7–4.0)
MCH: 29.5 pg (ref 26.0–34.0)
MCHC: 30.5 g/dL (ref 30.0–36.0)
MCV: 96.7 fL (ref 78.0–100.0)
MONO ABS: 0.8 10*3/uL (ref 0.1–1.0)
Monocytes Relative: 5 %
Neutro Abs: 15.1 10*3/uL — ABNORMAL HIGH (ref 1.7–7.7)
Neutrophils Relative %: 85 %
PLATELETS: 203 10*3/uL (ref 150–400)
RBC: 3.66 MIL/uL — ABNORMAL LOW (ref 3.87–5.11)
RDW: 13.3 % (ref 11.5–15.5)
WBC: 17.6 10*3/uL — ABNORMAL HIGH (ref 4.0–10.5)

## 2016-11-08 LAB — URINALYSIS, ROUTINE W REFLEX MICROSCOPIC
GLUCOSE, UA: 50 mg/dL — AB
Ketones, ur: NEGATIVE mg/dL
LEUKOCYTES UA: NEGATIVE
NITRITE: NEGATIVE
Protein, ur: 100 mg/dL — AB
SPECIFIC GRAVITY, URINE: 1.021 (ref 1.005–1.030)
pH: 5 (ref 5.0–8.0)

## 2016-11-08 MED ORDER — DEXTROSE-NACL 5-0.45 % IV SOLN
INTRAVENOUS | Status: DC
Start: 2016-11-08 — End: 2016-11-09
  Administered 2016-11-08: 18:00:00 via INTRAVENOUS

## 2016-11-08 MED ORDER — ACETAMINOPHEN 325 MG PO TABS
650.0000 mg | ORAL_TABLET | Freq: Four times a day (QID) | ORAL | Status: DC | PRN
  Administered 2016-11-11: 650 mg via ORAL
  Filled 2016-11-08: qty 2

## 2016-11-08 MED ORDER — SODIUM CHLORIDE 0.9 % IV SOLN: 250.0000 mL | INTRAVENOUS | Status: DC | PRN

## 2016-11-08 MED ORDER — DEXTROSE 5 % IV SOLN
1.0000 g | INTRAVENOUS | Status: DC
  Administered 2016-11-09 – 2016-11-13 (×5): 1 g via INTRAVENOUS
  Filled 2016-11-08 (×5): qty 10

## 2016-11-08 MED ORDER — BISACODYL 5 MG PO TBEC
5.0000 mg | DELAYED_RELEASE_TABLET | Freq: Every day | ORAL | Status: DC | PRN
  Filled 2016-11-08: qty 1

## 2016-11-08 MED ORDER — ONDANSETRON HCL 4 MG/2ML IJ SOLN: 4.0000 mg | Freq: Four times a day (QID) | INTRAMUSCULAR | Status: DC | PRN

## 2016-11-08 MED ORDER — SODIUM CHLORIDE 0.9 % IV BOLUS (SEPSIS)
500.0000 mL | Freq: Once | INTRAVENOUS | Status: AC
  Administered 2016-11-08: 500 mL via INTRAVENOUS

## 2016-11-08 MED ORDER — DONEPEZIL HCL 5 MG PO TABS
5.0000 mg | ORAL_TABLET | Freq: Every day | ORAL | Status: DC
  Administered 2016-11-10 – 2016-11-12 (×3): 5 mg via ORAL
  Filled 2016-11-08 (×3): qty 1

## 2016-11-08 MED ORDER — ADULT MULTIVITAMIN W/MINERALS CH
1.0000 | ORAL_TABLET | Freq: Every day | ORAL | Status: DC
Start: 2016-11-09 — End: 2016-11-13
  Administered 2016-11-09 – 2016-11-13 (×5): 1 via ORAL
  Filled 2016-11-08 (×5): qty 1

## 2016-11-08 MED ORDER — SODIUM CHLORIDE 0.9% FLUSH
3.0000 mL | Freq: Two times a day (BID) | INTRAVENOUS | Status: DC
  Administered 2016-11-10: 3 mL via INTRAVENOUS

## 2016-11-08 MED ORDER — SODIUM CHLORIDE 0.9% FLUSH
3.0000 mL | Freq: Two times a day (BID) | INTRAVENOUS | Status: DC
  Administered 2016-11-09 – 2016-11-13 (×6): 3 mL via INTRAVENOUS

## 2016-11-08 MED ORDER — SODIUM CHLORIDE 0.9 % IV BOLUS (SEPSIS)
1000.0000 mL | Freq: Once | INTRAVENOUS | Status: AC
  Administered 2016-11-08: 1000 mL via INTRAVENOUS

## 2016-11-08 MED ORDER — DEXTROSE-NACL 5-0.9 % IV SOLN
INTRAVENOUS | Status: DC
  Administered 2016-11-08 – 2016-11-09 (×2): via INTRAVENOUS

## 2016-11-08 MED ORDER — ACETAMINOPHEN 650 MG RE SUPP: 650.0000 mg | Freq: Four times a day (QID) | RECTAL | Status: DC | PRN

## 2016-11-08 MED ORDER — SODIUM CHLORIDE 0.9% FLUSH
3.0000 mL | INTRAVENOUS | Status: DC | PRN
  Administered 2016-11-09: 3 mL via INTRAVENOUS
  Filled 2016-11-08: qty 3

## 2016-11-08 MED ORDER — POLYETHYLENE GLYCOL 3350 17 G PO PACK: 17.0000 g | PACK | Freq: Every day | ORAL | Status: DC | PRN

## 2016-11-08 MED ORDER — DEXTROSE 5 % IV SOLN
1.0000 g | Freq: Once | INTRAVENOUS | Status: AC
  Administered 2016-11-08: 1 g via INTRAVENOUS
  Filled 2016-11-08: qty 10

## 2016-11-08 MED ORDER — ONDANSETRON HCL 4 MG PO TABS: 4.0000 mg | ORAL_TABLET | Freq: Four times a day (QID) | ORAL | Status: DC | PRN

## 2016-11-08 MED ORDER — THIORIDAZINE HCL 50 MG PO TABS
50.0000 mg | ORAL_TABLET | Freq: Two times a day (BID) | ORAL | Status: DC
  Administered 2016-11-09 – 2016-11-10 (×2): 50 mg via ORAL
  Filled 2016-11-08 (×3): qty 1

## 2016-11-08 MED ORDER — VITAMIN D 1000 UNITS PO TABS
1000.0000 [IU] | ORAL_TABLET | Freq: Every day | ORAL | Status: DC
  Administered 2016-11-09 – 2016-11-13 (×5): 1000 [IU] via ORAL
  Filled 2016-11-08 (×5): qty 1

## 2016-11-08 MED ORDER — LACTATED RINGERS IV BOLUS (SEPSIS)
1000.0000 mL | Freq: Once | INTRAVENOUS | Status: AC
  Administered 2016-11-08: 1000 mL via INTRAVENOUS

## 2016-11-08 MED ORDER — CLOPIDOGREL BISULFATE 75 MG PO TABS
75.0000 mg | ORAL_TABLET | Freq: Every day | ORAL | Status: DC
  Administered 2016-11-09: 75 mg via ORAL
  Filled 2016-11-08: qty 1

## 2016-11-08 NOTE — ED Notes (Signed)
MD made aware of trending blood pressures. 

## 2016-11-08 NOTE — ED Notes (Signed)
MD made aware of trending BP. 

## 2016-11-08 NOTE — ED Notes (Signed)
ED Provider at bedside. 

## 2016-11-08 NOTE — ED Notes (Addendum)
Per 5W nurse, patient bed changed to 3W at [facility]. Family and [facility] updated on bed change.

## 2016-11-08 NOTE — ED Notes (Signed)
XR at bedside

## 2016-11-08 NOTE — ED Provider Notes (Addendum)
[facility]-EMERGENCY DEPT Provider Note   CSN: [id number] Arrival date & time: 11/08/16  1206     History   Chief Complaint Chief Complaint  Patient presents with  . Weakness    HPI Cheyenne Marsh is a 66 y.o. female.  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 LEVEL 5 CAVEAT FOR COGNITIVE DELAY.  Per sister, patient has been declining mentally over the past several days, but since yday night she hasn't been acting normal self and hasnt been eating or drinking. Pt also had a few falls the last few days, and yday she fell from the toilet head first. Pt now acting weak and not cooperating in any activity.  Pt arrived to the ER tachycardic, hypotensive and she looks extremely dry.   Past Medical History:  Diagnosis Date  . Cerebral palsy (HCC)   . Stroke [facility])     Patient Active Problem List   Diagnosis Date Noted  . Sepsis (HCC) 11/08/2016  . Abnormal CT of spine 11/08/2016  . Acute renal failure (ARF) (HCC) 11/08/2016  . Dehydration 11/08/2016  . Cerebral palsy (HCC) 11/08/2016  . Muscle weakness of extremity 11/08/2016    History reviewed. No pertinent surgical history.  OB History    No data available       Home Medications    Prior to Admission medications   Medication Sig Start Date End Date Taking? Authorizing Provider  bisacodyl (DULCOLAX) 5 MG EC tablet Take 5 mg by mouth daily as needed for moderate constipation.   Yes [provider]  cholecalciferol (VITAMIN D) 1000 units tablet Take 1,000 Units by mouth daily.   Yes [provider]  clopidogrel (PLAVIX) 75 MG tablet Take 75 mg by mouth daily with breakfast.   Yes [provider]  donepezil (ARICEPT) 5 MG tablet Take 5 mg by mouth at bedtime.   Yes [provider]  Multiple Vitamin (MULTIVITAMIN) tablet Take 1 tablet by mouth daily.   Yes [provider]  thioridazine (MELLARIL) 50 MG tablet Take 50 mg by mouth 2 (two) times daily.    Yes  [provider]    Family History History reviewed. No pertinent family history.  Social History Social History  Substance Use Topics  . Smoking status: Never Smoker  . Smokeless tobacco: Not on file  . Alcohol use No     Allergies   Patient has no known allergies.   Review of Systems Review of Systems  Unable to perform ROS: Patient nonverbal     Physical Exam Updated Vital Signs BP 95/66   Pulse (!) 101   Temp 99 F (37.2 C) (Rectal)   Resp (!) 21   Ht 5\' 3"  (1.6 m)   Wt 59 kg (130 lb)   SpO2 98%   BMI 23.03 kg/m   Physical Exam  Constitutional: She is oriented to person, place, and time. She appears well-developed.  HENT:  Head: Normocephalic and atraumatic.  Eyes: EOM are normal.  Neck: Normal range of motion. Neck supple.  Cardiovascular:  tachycardia  Pulmonary/Chest: Effort normal.  Abdominal: Bowel sounds are normal.  Neurological: She is alert and oriented to person, place, and time.  Skin: Skin is warm and dry.  Nursing note and vitals reviewed.    ED Treatments / Results  Labs (all labs ordered are listed, but only abnormal results are displayed) Labs Reviewed  COMPREHENSIVE METABOLIC PANEL - Abnormal; Notable for the following:  Result Value   Sodium 156 (*)    Chloride 122 (*)    Glucose, Bld 152 (*)    BUN 48 (*)    Creatinine, Ser 2.38 (*)    Albumin 3.3 (*)    GFR calc non Af Amer 20 (*)    GFR calc Af Amer 24 (*)    All other components within normal limits  CBC WITH DIFFERENTIAL/PLATELET - Abnormal; Notable for the following:    WBC 17.6 (*)    RBC 3.66 (*)    Hemoglobin 10.8 (*)    HCT 35.4 (*)    Neutro Abs 15.1 (*)    All other components within normal limits  URINALYSIS, ROUTINE W REFLEX MICROSCOPIC - Abnormal; Notable for the following:    Color, Urine AMBER (*)    APPearance CLOUDY (*)    Glucose, UA 50 (*)    Hgb urine dipstick SMALL (*)    Bilirubin Urine SMALL (*)    Protein, ur 100 (*)     Bacteria, UA MANY (*)    Squamous Epithelial / LPF 0-5 (*)    All other components within normal limits  I-STAT CG4 LACTIC ACID, ED - Abnormal; Notable for the following:    Lactic Acid, Venous 3.63 (*)    All other components within normal limits  I-STAT CG4 LACTIC ACID, ED - Abnormal; Notable for the following:    Lactic Acid, Venous 3.04 (*)    All other components within normal limits  CULTURE, BLOOD (ROUTINE X 2)  CULTURE, BLOOD (ROUTINE X 2)  URINE CULTURE  LACTIC ACID, PLASMA  I-STAT CG4 LACTIC ACID, ED    EKG  EKG Interpretation  Date/Time:  Saturday November 08 2016 12:46:42 EDT Ventricular Rate:  118 PR Interval:    QRS Duration: 72 QT Interval:  346 QTC Calculation: 487 R Axis:   64 Text Interpretation:  Sinus tachycardia Nonspecific T abnormalities, diffuse leads Borderline prolonged QT interval TWI in new v3-v6 Confirmed by Derwood Kaplan [phone]) on 11/08/2016 1:15:17 PM       Radiology Ct Head Wo Contrast  Result Date: 11/08/2016 CLINICAL DATA:  Multiple recent falls.  Decreased mental status. EXAM: CT HEAD WITHOUT CONTRAST CT MAXILLOFACIAL WITHOUT CONTRAST CT CERVICAL SPINE WITHOUT CONTRAST TECHNIQUE: Multidetector CT imaging of the head, cervical spine, and maxillofacial structures were performed using the standard protocol without intravenous contrast. Multiplanar CT image reconstructions of the cervical spine and maxillofacial structures were also generated. COMPARISON:  August 27, 2008 FINDINGS: CT HEAD FINDINGS Brain: No subdural, epidural, or subarachnoid hemorrhage. Cerebellum, brainstem, and basal cisterns are normal. Encephalomalacia is seen in the right frontal lobe, unchanged since 2010, consistent with remote infarct. No acute cortical ischemia or infarct is identified. The ventricles and sulci are unchanged within visualize limits. No mass effect or midline shift. Vascular: No hyperdense vessel or unexpected calcification. Skull: Normal. Negative for fracture  or focal lesion. Other: None. CT MAXILLOFACIAL FINDINGS Osseous: No fracture or mandibular dislocation. No destructive process. Orbits: Negative. No traumatic or inflammatory finding. Sinuses: Clear. Soft tissues: Negative. CT CERVICAL SPINE FINDINGS Alignment: The pre odontoid space is more narrow in the interval, due to increasing hypertrophy of the anterior arch of C1. There is straightening of normal lordosis which is similar in the interval. The right lateral mass of C1 appears to have slipped more anteriorly versus C2 in the interval. There was some anterolisthesis in this region previously but the listhesis measures 7 mm on series 19, image 19 today versus 3.7  mm previously. There is no significant anterolisthesis on the left at this level. No fractures are seen. Skull base and vertebrae: No fractures are identified. Soft tissues and spinal canal: No prevertebral soft tissue swelling identified. There is increased attenuation in the fat at the base of the neck on the right such as on series 17, image 17. 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 Increased canal narrowing is seen at the level of the odontoid process due to increased anterolisthesis of C1 versus C2 on the right. The canal measures 6.8 mm at this level. The diameter of the canal is otherwise similar in the interval. Disc levels: Severe multilevel degenerative changes with anterior osteophytes. Upper chest: Negative. Other: No other abnormalities. IMPRESSION: 1. No fracture identified in the cervical spine. However, C1 appears to be anterolisthesed versus C2, particularly on the right. While this was present previously, it has increased in the interval and canal narrowing at the level of the odontoid process has significantly worsened in the interval. The canal measures 6.8 mm at this level today. An MRI could better assess the cord at this level. Pressure on the cord at this level could result in the reported symptoms of decreased ability  to perform ADL's. I suspect this is most likely due to degenerative change or ligamentous laxity rather than traumatic change. 2. Chronic right frontal infarct. No acute intracranial abnormality. 3. No facial bone fracture identified. 4. Increased attenuation in the fat at the base of neck on the right is likely posttraumatic given history. No large hematoma seen in this region. No fractures seen in the adjacent bones. CT imaging in this region could further assess if clinically warranted. Findings called to Dr. Rhunette CroftNanavati. Electronically Signed   By: Gerome Samavid  Williams III M.D   On: 11/08/2016 14:57   Ct Cervical Spine Wo Contrast  Result Date: 11/08/2016 CLINICAL DATA:  Multiple recent falls.  Decreased mental status. EXAM: CT HEAD WITHOUT CONTRAST CT MAXILLOFACIAL WITHOUT CONTRAST CT CERVICAL SPINE WITHOUT CONTRAST TECHNIQUE: Multidetector CT imaging of the head, cervical spine, and maxillofacial structures were performed using the standard protocol without intravenous contrast. Multiplanar CT image reconstructions of the cervical spine and maxillofacial structures were also generated. COMPARISON:  August 27, 2008 FINDINGS: CT HEAD FINDINGS Brain: No subdural, epidural, or subarachnoid hemorrhage. Cerebellum, brainstem, and basal cisterns are normal. Encephalomalacia is seen in the right frontal lobe, unchanged since 2010, consistent with remote infarct. No acute cortical ischemia or infarct is identified. The ventricles and sulci are unchanged within visualize limits. No mass effect or midline shift. Vascular: No hyperdense vessel or unexpected calcification. Skull: Normal. Negative for fracture or focal lesion. Other: None. CT MAXILLOFACIAL FINDINGS Osseous: No fracture or mandibular dislocation. No destructive process. Orbits: Negative. No traumatic or inflammatory finding. Sinuses: Clear. Soft tissues: Negative. CT CERVICAL SPINE FINDINGS Alignment: The pre odontoid space is more narrow in the interval, due to  increasing hypertrophy of the anterior arch of C1. There is straightening of normal lordosis which is similar in the interval. The right lateral mass of C1 appears to have slipped more anteriorly versus C2 in the interval. There was some anterolisthesis in this region previously but the listhesis measures 7 mm on series 19, image 19 today versus 3.7 mm previously. There is no significant anterolisthesis on the left at this level. No fractures are seen. Skull base and vertebrae: No fractures are identified. Soft tissues and spinal canal: No prevertebral soft tissue swelling identified. There is increased attenuation in the fat at the  base of the neck on the right such as on series 17, image 17. 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 Increased canal narrowing is seen at the level of the odontoid process due to increased anterolisthesis of C1 versus C2 on the right. The canal measures 6.8 mm at this level. The diameter of the canal is otherwise similar in the interval. Disc levels: Severe multilevel degenerative changes with anterior osteophytes. Upper chest: Negative. Other: No other abnormalities. IMPRESSION: 1. No fracture identified in the cervical spine. However, C1 appears to be anterolisthesed versus C2, particularly on the right. While this was present previously, it has increased in the interval and canal narrowing at the level of the odontoid process has significantly worsened in the interval. The canal measures 6.8 mm at this level today. An MRI could better assess the cord at this level. Pressure on the cord at this level could result in the reported symptoms of decreased ability to perform ADL's. I suspect this is most likely due to degenerative change or ligamentous laxity rather than traumatic change. 2. Chronic right frontal infarct. No acute intracranial abnormality. 3. No facial bone fracture identified. 4. Increased attenuation in the fat at the base of neck on the right is likely  posttraumatic given history. No large hematoma seen in this region. No fractures seen in the adjacent bones. CT imaging in this region could further assess if clinically warranted. Findings called to Dr. Rhunette Croft. Electronically Signed   By: Gerome Sam III M.D   On: 11/08/2016 14:57   Dg Chest Port 1 View  Result Date: 11/08/2016 CLINICAL DATA:  History of cerebral palsy.  Nonverbal patient. EXAM: PORTABLE CHEST 1 VIEW COMPARISON:  06/17/2013 FINDINGS: Cardiomediastinal silhouette is normal. Mediastinal contours appear intact. There is no evidence of focal airspace consolidation, pleural effusion or pneumothorax. Bronchiectasis with mild peribronchial thickening in the right upper lobe. Osseous structures are without acute abnormality. Soft tissues are grossly normal. IMPRESSION: Bronchiectasis with mild peribronchial thickening in the right upper lobe. Electronically Signed   By: Ted Mcalpine M.D.   On: 11/08/2016 13:34   Ct Maxillofacial Wo Contrast  Result Date: 11/08/2016 CLINICAL DATA:  Multiple recent falls.  Decreased mental status. EXAM: CT HEAD WITHOUT CONTRAST CT MAXILLOFACIAL WITHOUT CONTRAST CT CERVICAL SPINE WITHOUT CONTRAST TECHNIQUE: Multidetector CT imaging of the head, cervical spine, and maxillofacial structures were performed using the standard protocol without intravenous contrast. Multiplanar CT image reconstructions of the cervical spine and maxillofacial structures were also generated. COMPARISON:  August 27, 2008 FINDINGS: CT HEAD FINDINGS Brain: No subdural, epidural, or subarachnoid hemorrhage. Cerebellum, brainstem, and basal cisterns are normal. Encephalomalacia is seen in the right frontal lobe, unchanged since 2010, consistent with remote infarct. No acute cortical ischemia or infarct is identified. The ventricles and sulci are unchanged within visualize limits. No mass effect or midline shift. Vascular: No hyperdense vessel or unexpected calcification. Skull: Normal.  Negative for fracture or focal lesion. Other: None. CT MAXILLOFACIAL FINDINGS Osseous: No fracture or mandibular dislocation. No destructive process. Orbits: Negative. No traumatic or inflammatory finding. Sinuses: Clear. Soft tissues: Negative. CT CERVICAL SPINE FINDINGS Alignment: The pre odontoid space is more narrow in the interval, due to increasing hypertrophy of the anterior arch of C1. There is straightening of normal lordosis which is similar in the interval. The right lateral mass of C1 appears to have slipped more anteriorly versus C2 in the interval. There was some anterolisthesis in this region previously but the listhesis measures 7 mm on series 19,  image 19 today versus 3.7 mm previously. There is no significant anterolisthesis on the left at this level. No fractures are seen. Skull base and vertebrae: No fractures are identified. Soft tissues and spinal canal: No prevertebral soft tissue swelling identified. There is increased attenuation in the fat at the base of the neck on the right such as on series 17, image 17. 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 Increased canal narrowing is seen at the level of the odontoid process due to increased anterolisthesis of C1 versus C2 on the right. The canal measures 6.8 mm at this level. The diameter of the canal is otherwise similar in the interval. Disc levels: Severe multilevel degenerative changes with anterior osteophytes. Upper chest: Negative. Other: No other abnormalities. IMPRESSION: 1. No fracture identified in the cervical spine. However, C1 appears to be anterolisthesed versus C2, particularly on the right. While this was present previously, it has increased in the interval and canal narrowing at the level of the odontoid process has significantly worsened in the interval. The canal measures 6.8 mm at this level today. An MRI could better assess the cord at this level. Pressure on the cord at this level could result in the reported symptoms  of decreased ability to perform ADL's. I suspect this is most likely due to degenerative change or ligamentous laxity rather than traumatic change. 2. Chronic right frontal infarct. No acute intracranial abnormality. 3. No facial bone fracture identified. 4. Increased attenuation in the fat at the base of neck on the right is likely posttraumatic given history. No large hematoma seen in this region. No fractures seen in the adjacent bones. CT imaging in this region could further assess if clinically warranted. Findings called to Dr. Rhunette Croft. Electronically Signed   By: Gerome Sam III M.D   On: 11/08/2016 14:57    Procedures Procedures (including critical care time) CRITICAL CARE Performed by: Derwood Kaplan   Total critical care time:40  minutes  Critical care time was exclusive of separately billable procedures and treating other patients.  Critical care was necessary to treat or prevent imminent or life-threatening deterioration.  Critical care was time spent personally by me on the following activities: development of treatment plan with patient and/or surrogate as well as nursing, discussions with consultants, evaluation of patient's response to treatment, examination of patient, obtaining history from patient or surrogate, ordering and performing treatments and interventions, ordering and review of laboratory studies, ordering and review of radiographic studies, pulse oximetry and re-evaluation of patient's condition.    Medications Ordered in ED Medications  cefTRIAXone (ROCEPHIN) 1 g in dextrose 5 % 50 mL IVPB (not administered)  lactated ringers bolus 1,000 mL (1,000 mLs Intravenous New Bag/Given 11/08/16 1628)  sodium chloride 0.9 % bolus 1,000 mL (0 mLs Intravenous Stopped 11/08/16 1609)    And  sodium chloride 0.9 % bolus 1,000 mL (0 mLs Intravenous Stopped 11/08/16 1418)  cefTRIAXone (ROCEPHIN) 1 g in dextrose 5 % 50 mL IVPB (0 g Intravenous Stopped 11/08/16 1513)      Initial Impression / Assessment and Plan / ED Course  I have reviewed the triage vital signs and the nursing notes.  Pertinent labs & imaging results that were available during my care of the patient were reviewed by me and considered in my medical decision making (see chart for details).  Clinical Course as of Nov 09 1650  Sat Nov 08, 2016  1651 Radiologist called for possible non acute cord compression, as the canal at c1 looks narrow.  MRI ordered. Pt will need sedation for the MRI, thus she will need to be transferred. MRi can be urgent and not emergent, and I spoke with Dr. Mervyn Gay team and they will see the patient tomorrow. Results from the ER workup discussed with the family face to face and all questions answered to the best of my ability.  CT Cervical Spine Wo Contrast [AN]    Clinical Course User Index [AN] Derwood Kaplan, MD    Pt comes in with cc of weakness. She has CP. She is noted to be hypotensive, tachycardic and also extremely dry. Pt's mouth is also open, and she had a fall, face first yday.  - Shock workup initiated. Sounds less likely to be sepsis, and more likely to be hypovolemia. - fluid resuscitation started. - CXR and CT head, Cspine and Ct face ordered - r/o brain bleed, jaw fracutres. - SW consulted. No clear signs of assault, but home help situation might need to be reassessed.   Final Clinical Impressions(s) / ED Diagnoses   Final diagnoses:  Severe dehydration  Hypovolemic shock (HCC)  AKI (acute kidney injury) (HCC)    New Prescriptions New Prescriptions   No medications on file     Derwood Kaplan, MD 11/08/16 1415    Derwood Kaplan, MD 11/08/16 [phone]

## 2016-11-08 NOTE — ED Notes (Signed)
MD aware of trending blood pressures.

## 2016-11-08 NOTE — ED Triage Notes (Signed)
Per EMS, patient from home, where caregiver reports she is unable to assist patient with ADL's since April. Caregiver reports she wants patient "taken to the hospital and checked out." Patient is nonverbal. Hx cerebral palsy. Baseline per caregiver.  BP 126/82 HR 74 RR 22 CBG 145

## 2016-11-08 NOTE — ED Notes (Signed)
Patient's sister, Elease Hashimotoatricia, [phone] and family member, Clancy Gourdaiesha [phone] requesting updates.

## 2016-11-08 NOTE — ED Notes (Signed)
[facility] called for transport. 

## 2016-11-08 NOTE — ED Notes (Signed)
Hospitalist at bedside 

## 2016-11-08 NOTE — ED Notes (Signed)
Patient transported to CT 

## 2016-11-08 NOTE — H&P (Signed)
History and Physical    Genesee Nase ZOX:[id number] DOB: Feb 05, 1951 DOA: 11/08/2016  Referring MD/NP/PA:  PCP: Patient, No Pcp Per  Outpatient Specialists:  Patient coming from: Home  Chief Complaint: Generalized weakness  HPI: Cheyenne Marsh is a 66 y.o. female with medical history significant for history of cerebral palsy and CVA. She lives at home and is being cared for by her sister. She was brought to the ED by sister because of progressively worsening generalized weakness with decreased responsiveness and decreased oral intake. No known history of fever or chills. No history of cough. Of note about 3 years ago patient was noted to be progressively declining from the standpoint of being able to care for self. Previously she was able to walk and right and it by herself. He mentioned that she was seen by providers and they could not find out exactly the cause of the problem was felt to be progression of her cerebral palsy. Over last 3 months patient has worsened, being nonverbal, not able to woke up all, and totally dependent on her ADLs. She also had had  multiple falls her head on both sets. She is normally nonverbal but able to follow cues.  ED Course: At the ED patient was noted to be tachycardic and hypotensive with clinical dehydration. Her lab work indicated acute renal dysfunction from 2017. On account of her falls CT scan was done with other imaging studies are incidental finding of a lesion in the cervical region was noted. In view of her recent acute estrogen weakness historically no surgery was consulted and requested that patient be transferred to [facility] for MRI as well as neurosurgical review.    Review of Systems: Not able to provide consistently reproducibly.    Past Medical History:  Diagnosis Date  . Cerebral palsy (HCC)   . Stroke [facility])     History reviewed. No pertinent surgical history.   reports that she has never smoked. She does not have any smokeless  tobacco history on file. She reports that she does not drink alcohol. Her drug history is not on file.  No Known Allergies  History reviewed. No pertinent family history.   Prior to Admission medications   Medication Sig Start Date End Date Taking? Authorizing Provider  bisacodyl (DULCOLAX) 5 MG EC tablet Take 5 mg by mouth daily as needed for moderate constipation.   Yes [provider]  cholecalciferol (VITAMIN D) 1000 units tablet Take 1,000 Units by mouth daily.   Yes [provider]  clopidogrel (PLAVIX) 75 MG tablet Take 75 mg by mouth daily with breakfast.   Yes [provider]  donepezil (ARICEPT) 5 MG tablet Take 5 mg by mouth at bedtime.   Yes [provider]  Multiple Vitamin (MULTIVITAMIN) tablet Take 1 tablet by mouth daily.   Yes [provider]  thioridazine (MELLARIL) 50 MG tablet Take 50 mg by mouth 2 (two) times daily.    Yes [provider]    Physical Exam: Vitals:   11/08/16 1445 11/08/16 1500 11/08/16 1530 11/08/16 1545  BP: 93/71 (!) 89/69 93/66 95/63   Pulse: (!) 102 (!) 102 100 (!) 102  Resp: 18 16 13 19   Temp:      TempSrc:      SpO2: 95% 95% 98% 98%  Weight:      Height:          Constitutional: NAD, calm, comfortable Vitals:   11/08/16 1445 11/08/16 1500 11/08/16 1530 11/08/16 1545  BP: 93/71 Marland Kitchen)  89/69 93/66 95/63   Pulse: (!) 102 (!) 102 100 (!) 102  Resp: 18 16 13 19   Temp:      TempSrc:      SpO2: 95% 95% 98% 98%  Weight:      Height:       Eyes: PERRL, conjunctival pallor ENMT: Dry mucous membranes. Posterior pharynx clear of any exudate Neck: normal, supple, no masses, no thyromegaly Respiratory: clear to auscultation bilaterally, no wheezing, no crackles. Normal respiratory effort. No accessory muscle use.  Cardiovascular: Mild tachycardic, regular rhythm, no murmurs / rubs / gallops.  Abdomen: no tenderness, no masses palpated. No hepatosplenomegaly. Bowel sounds positive.    Musculoskeletal: (+) contractures. Poor muscle tone.  Skin: no rashes CNS: Alert, follows simple commands, nonverbal, moves all extremities however.      Labs on Admission: I have personally reviewed following labs and imaging studies  CBC:  Recent Labs Lab 11/08/16 1302  WBC 17.6*  NEUTROABS 15.1*  HGB 10.8*  HCT 35.4*  MCV 96.7  PLT 203   Basic Metabolic Panel:  Recent Labs Lab 11/08/16 1302  NA 156*  K 5.0  CL 122*  CO2 23  GLUCOSE 152*  BUN 48*  CREATININE 2.38*  CALCIUM 9.5   GFR: Estimated Creatinine Clearance: 19.8 mL/min (A) (by C-G formula based on SCr of 2.38 mg/dL (H)). Liver Function Tests:  Recent Labs Lab 11/08/16 1302  AST 24  ALT 19  ALKPHOS 65  BILITOT 0.4  PROT 7.4  ALBUMIN 3.3*   No results for input(s): LIPASE, AMYLASE in the last 168 hours. No results for input(s): AMMONIA in the last 168 hours. Coagulation Profile: No results for input(s): INR, PROTIME in the last 168 hours. Cardiac Enzymes: No results for input(s): CKTOTAL, CKMB, CKMBINDEX, TROPONINI in the last 168 hours. BNP (last 3 results) No results for input(s): PROBNP in the last 8760 hours. HbA1C: No results for input(s): HGBA1C in the last 72 hours. CBG: No results for input(s): GLUCAP in the last 168 hours. Lipid Profile: No results for input(s): CHOL, HDL, LDLCALC, TRIG, CHOLHDL, LDLDIRECT in the last 72 hours. Thyroid Function Tests: No results for input(s): TSH, T4TOTAL, FREET4, T3FREE, THYROIDAB in the last 72 hours. Anemia Panel: No results for input(s): VITAMINB12, FOLATE, FERRITIN, TIBC, IRON, RETICCTPCT in the last 72 hours. Urine analysis:    Component Value Date/Time   COLORURINE AMBER (A) 11/08/2016 1251   APPEARANCEUR CLOUDY (A) 11/08/2016 1251   LABSPEC 1.021 11/08/2016 1251   PHURINE 5.0 11/08/2016 1251   GLUCOSEU 50 (A) 11/08/2016 1251   HGBUR SMALL (A) 11/08/2016 1251   BILIRUBINUR SMALL (A) 11/08/2016 1251   KETONESUR NEGATIVE 11/08/2016  1251   PROTEINUR 100 (A) 11/08/2016 1251   UROBILINOGEN 4.0 (H) 06/17/2013 1907   NITRITE NEGATIVE 11/08/2016 1251   LEUKOCYTESUR NEGATIVE 11/08/2016 1251   Sepsis Labs: @LABRCNTIP (procalcitonin:4,lacticidven:4) )No results found for this or any previous visit (from the past 240 hour(s)).   Radiological Exams on Admission: Ct Head Wo Contrast  Result Date: 11/08/2016 CLINICAL DATA:  Multiple recent falls.  Decreased mental status. EXAM: CT HEAD WITHOUT CONTRAST CT MAXILLOFACIAL WITHOUT CONTRAST CT CERVICAL SPINE WITHOUT CONTRAST TECHNIQUE: Multidetector CT imaging of the head, cervical spine, and maxillofacial structures were performed using the standard protocol without intravenous contrast. Multiplanar CT image reconstructions of the cervical spine and maxillofacial structures were also generated. COMPARISON:  August 27, 2008 FINDINGS: CT HEAD FINDINGS Brain: No subdural, epidural, or subarachnoid hemorrhage. Cerebellum, brainstem, and basal cisterns are normal. Encephalomalacia  is seen in the right frontal lobe, unchanged since 2010, consistent with remote infarct. No acute cortical ischemia or infarct is identified. The ventricles and sulci are unchanged within visualize limits. No mass effect or midline shift. Vascular: No hyperdense vessel or unexpected calcification. Skull: Normal. Negative for fracture or focal lesion. Other: None. CT MAXILLOFACIAL FINDINGS Osseous: No fracture or mandibular dislocation. No destructive process. Orbits: Negative. No traumatic or inflammatory finding. Sinuses: Clear. Soft tissues: Negative. CT CERVICAL SPINE FINDINGS Alignment: The pre odontoid space is more narrow in the interval, due to increasing hypertrophy of the anterior arch of C1. There is straightening of normal lordosis which is similar in the interval. The right lateral mass of C1 appears to have slipped more anteriorly versus C2 in the interval. There was some anterolisthesis in this region previously  but the listhesis measures 7 mm on series 19, image 19 today versus 3.7 mm previously. There is no significant anterolisthesis on the left at this level. No fractures are seen. Skull base and vertebrae: No fractures are identified. Soft tissues and spinal canal: No prevertebral soft tissue swelling identified. There is increased attenuation in the fat at the base of the neck on the right such as on series 17, image 17. 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 Increased canal narrowing is seen at the level of the odontoid process due to increased anterolisthesis of C1 versus C2 on the right. The canal measures 6.8 mm at this level. The diameter of the canal is otherwise similar in the interval. Disc levels: Severe multilevel degenerative changes with anterior osteophytes. Upper chest: Negative. Other: No other abnormalities. IMPRESSION: 1. No fracture identified in the cervical spine. However, C1 appears to be anterolisthesed versus C2, particularly on the right. While this was present previously, it has increased in the interval and canal narrowing at the level of the odontoid process has significantly worsened in the interval. The canal measures 6.8 mm at this level today. An MRI could better assess the cord at this level. Pressure on the cord at this level could result in the reported symptoms of decreased ability to perform ADL's. I suspect this is most likely due to degenerative change or ligamentous laxity rather than traumatic change. 2. Chronic right frontal infarct. No acute intracranial abnormality. 3. No facial bone fracture identified. 4. Increased attenuation in the fat at the base of neck on the right is likely posttraumatic given history. No large hematoma seen in this region. No fractures seen in the adjacent bones. CT imaging in this region could further assess if clinically warranted. Findings called to Dr. Rhunette Croft. Electronically Signed   By: Gerome Sam III M.D   On: 11/08/2016 14:57    Ct Cervical Spine Wo Contrast  Result Date: 11/08/2016 CLINICAL DATA:  Multiple recent falls.  Decreased mental status. EXAM: CT HEAD WITHOUT CONTRAST CT MAXILLOFACIAL WITHOUT CONTRAST CT CERVICAL SPINE WITHOUT CONTRAST TECHNIQUE: Multidetector CT imaging of the head, cervical spine, and maxillofacial structures were performed using the standard protocol without intravenous contrast. Multiplanar CT image reconstructions of the cervical spine and maxillofacial structures were also generated. COMPARISON:  August 27, 2008 FINDINGS: CT HEAD FINDINGS Brain: No subdural, epidural, or subarachnoid hemorrhage. Cerebellum, brainstem, and basal cisterns are normal. Encephalomalacia is seen in the right frontal lobe, unchanged since 2010, consistent with remote infarct. No acute cortical ischemia or infarct is identified. The ventricles and sulci are unchanged within visualize limits. No mass effect or midline shift. Vascular: No hyperdense vessel or unexpected calcification. Skull:  Normal. Negative for fracture or focal lesion. Other: None. CT MAXILLOFACIAL FINDINGS Osseous: No fracture or mandibular dislocation. No destructive process. Orbits: Negative. No traumatic or inflammatory finding. Sinuses: Clear. Soft tissues: Negative. CT CERVICAL SPINE FINDINGS Alignment: The pre odontoid space is more narrow in the interval, due to increasing hypertrophy of the anterior arch of C1. There is straightening of normal lordosis which is similar in the interval. The right lateral mass of C1 appears to have slipped more anteriorly versus C2 in the interval. There was some anterolisthesis in this region previously but the listhesis measures 7 mm on series 19, image 19 today versus 3.7 mm previously. There is no significant anterolisthesis on the left at this level. No fractures are seen. Skull base and vertebrae: No fractures are identified. Soft tissues and spinal canal: No prevertebral soft tissue swelling identified. There is  increased attenuation in the fat at the base of the neck on the right such as on series 17, image 17. 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 Increased canal narrowing is seen at the level of the odontoid process due to increased anterolisthesis of C1 versus C2 on the right. The canal measures 6.8 mm at this level. The diameter of the canal is otherwise similar in the interval. Disc levels: Severe multilevel degenerative changes with anterior osteophytes. Upper chest: Negative. Other: No other abnormalities. IMPRESSION: 1. No fracture identified in the cervical spine. However, C1 appears to be anterolisthesed versus C2, particularly on the right. While this was present previously, it has increased in the interval and canal narrowing at the level of the odontoid process has significantly worsened in the interval. The canal measures 6.8 mm at this level today. An MRI could better assess the cord at this level. Pressure on the cord at this level could result in the reported symptoms of decreased ability to perform ADL's. I suspect this is most likely due to degenerative change or ligamentous laxity rather than traumatic change. 2. Chronic right frontal infarct. No acute intracranial abnormality. 3. No facial bone fracture identified. 4. Increased attenuation in the fat at the base of neck on the right is likely posttraumatic given history. No large hematoma seen in this region. No fractures seen in the adjacent bones. CT imaging in this region could further assess if clinically warranted. Findings called to Dr. Rhunette Croft. Electronically Signed   By: Gerome Sam III M.D   On: 11/08/2016 14:57   Dg Chest Port 1 View  Result Date: 11/08/2016 CLINICAL DATA:  History of cerebral palsy.  Nonverbal patient. EXAM: PORTABLE CHEST 1 VIEW COMPARISON:  06/17/2013 FINDINGS: Cardiomediastinal silhouette is normal. Mediastinal contours appear intact. There is no evidence of focal airspace consolidation, pleural effusion  or pneumothorax. Bronchiectasis with mild peribronchial thickening in the right upper lobe. Osseous structures are without acute abnormality. Soft tissues are grossly normal. IMPRESSION: Bronchiectasis with mild peribronchial thickening in the right upper lobe. Electronically Signed   By: Ted Mcalpine M.D.   On: 11/08/2016 13:34   Ct Maxillofacial Wo Contrast  Result Date: 11/08/2016 CLINICAL DATA:  Multiple recent falls.  Decreased mental status. EXAM: CT HEAD WITHOUT CONTRAST CT MAXILLOFACIAL WITHOUT CONTRAST CT CERVICAL SPINE WITHOUT CONTRAST TECHNIQUE: Multidetector CT imaging of the head, cervical spine, and maxillofacial structures were performed using the standard protocol without intravenous contrast. Multiplanar CT image reconstructions of the cervical spine and maxillofacial structures were also generated. COMPARISON:  August 27, 2008 FINDINGS: CT HEAD FINDINGS Brain: No subdural, epidural, or subarachnoid hemorrhage. Cerebellum, brainstem, and  basal cisterns are normal. Encephalomalacia is seen in the right frontal lobe, unchanged since 2010, consistent with remote infarct. No acute cortical ischemia or infarct is identified. The ventricles and sulci are unchanged within visualize limits. No mass effect or midline shift. Vascular: No hyperdense vessel or unexpected calcification. Skull: Normal. Negative for fracture or focal lesion. Other: None. CT MAXILLOFACIAL FINDINGS Osseous: No fracture or mandibular dislocation. No destructive process. Orbits: Negative. No traumatic or inflammatory finding. Sinuses: Clear. Soft tissues: Negative. CT CERVICAL SPINE FINDINGS Alignment: The pre odontoid space is more narrow in the interval, due to increasing hypertrophy of the anterior arch of C1. There is straightening of normal lordosis which is similar in the interval. The right lateral mass of C1 appears to have slipped more anteriorly versus C2 in the interval. There was some anterolisthesis in this  region previously but the listhesis measures 7 mm on series 19, image 19 today versus 3.7 mm previously. There is no significant anterolisthesis on the left at this level. No fractures are seen. Skull base and vertebrae: No fractures are identified. Soft tissues and spinal canal: No prevertebral soft tissue swelling identified. There is increased attenuation in the fat at the base of the neck on the right such as on series 17, image 17. 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 Increased canal narrowing is seen at the level of the odontoid process due to increased anterolisthesis of C1 versus C2 on the right. The canal measures 6.8 mm at this level. The diameter of the canal is otherwise similar in the interval. Disc levels: Severe multilevel degenerative changes with anterior osteophytes. Upper chest: Negative. Other: No other abnormalities. IMPRESSION: 1. No fracture identified in the cervical spine. However, C1 appears to be anterolisthesed versus C2, particularly on the right. While this was present previously, it has increased in the interval and canal narrowing at the level of the odontoid process has significantly worsened in the interval. The canal measures 6.8 mm at this level today. An MRI could better assess the cord at this level. Pressure on the cord at this level could result in the reported symptoms of decreased ability to perform ADL's. I suspect this is most likely due to degenerative change or ligamentous laxity rather than traumatic change. 2. Chronic right frontal infarct. No acute intracranial abnormality. 3. No facial bone fracture identified. 4. Increased attenuation in the fat at the base of neck on the right is likely posttraumatic given history. No large hematoma seen in this region. No fractures seen in the adjacent bones. CT imaging in this region could further assess if clinically warranted. Findings called to Dr. Rhunette Croft. Electronically Signed   By: Gerome Sam III M.D   On:  11/08/2016 14:57    EKG: Independently reviewed.   Assessment/Plan Principal Problem:   Sepsis (HCC) Active Problems:   Abnormal CT of spine   Acute renal failure (ARF) (HCC)   Dehydration   Cerebral palsy (HCC)   Muscle weakness of extremity    #1 Sepsis/SIRS:  IV fluid supplementation IV antibiotics   urine and blood cultures Chest x-ray.  #2 Dehydration: Due to decrease her intake IV rehydration Monitor renal function and electrolytes  #3 hypernatremia: Related diagnoses #2 Hypotonic fluid Monitor  #4 acute renal failure: Due to diagnosis #1 and 2 Monitor renal function with electrolytes Supportive care  #5 subacute neurologic deficits: Associated abdominal CT finding. Follow-up MRI report Neurosurgery to follow   #6 failure to thrive: Full ADL dependent History of cerebral palsy Nonambulatory  Nonverbal Palliative care consult to assist with goals of care discussions with family   DVT prophylaxis:  (SCD) Code Status: (Full) Family Communication: Sister at bedside  Disposition Plan:  (To be determined) Consults called: Neurosurgery (Dr Ditty ) Palliative Care Consult - Goals of care Admission status:  (inpatient / tele)   OSEI-BONSU,Harjit Douds MD [facility] Pager [phone]  If 7PM-7AM, please contact night-coverage [email or web address] Password [email or web address]  11/08/2016, 4:07 PM

## 2016-11-09 ENCOUNTER — Inpatient Hospital Stay (HOSPITAL_COMMUNITY): Payer: Medicare Other

## 2016-11-09 ENCOUNTER — Encounter (HOSPITAL_COMMUNITY): Payer: Self-pay | Admitting: *Deleted

## 2016-11-09 DIAGNOSIS — M6281 Muscle weakness (generalized): Secondary | ICD-10-CM

## 2016-11-09 DIAGNOSIS — E870 Hyperosmolality and hypernatremia: Secondary | ICD-10-CM

## 2016-11-09 DIAGNOSIS — L8990 Pressure ulcer of unspecified site, unspecified stage: Secondary | ICD-10-CM | POA: Insufficient documentation

## 2016-11-09 DIAGNOSIS — M4802 Spinal stenosis, cervical region: Secondary | ICD-10-CM

## 2016-11-09 LAB — COMPREHENSIVE METABOLIC PANEL
ALBUMIN: 2.4 g/dL — AB (ref 3.5–5.0)
ALT: 14 U/L (ref 14–54)
ANION GAP: 4 — AB (ref 5–15)
AST: 20 U/L (ref 15–41)
Alkaline Phosphatase: 54 U/L (ref 38–126)
BILIRUBIN TOTAL: 0.4 mg/dL (ref 0.3–1.2)
BUN: 36 mg/dL — AB (ref 6–20)
CHLORIDE: 125 mmol/L — AB (ref 101–111)
CO2: 25 mmol/L (ref 22–32)
Calcium: 8.2 mg/dL — ABNORMAL LOW (ref 8.9–10.3)
Creatinine, Ser: 1.35 mg/dL — ABNORMAL HIGH (ref 0.44–1.00)
GFR calc Af Amer: 47 mL/min — ABNORMAL LOW (ref >60)
GFR calc non Af Amer: 41 mL/min — ABNORMAL LOW (ref >60)
GLUCOSE: 155 mg/dL — AB (ref 65–99)
POTASSIUM: 4 mmol/L (ref 3.5–5.1)
SODIUM: 154 mmol/L — AB (ref 135–145)
Total Protein: 5.7 g/dL — ABNORMAL LOW (ref 6.5–8.1)

## 2016-11-09 LAB — CBC WITH DIFFERENTIAL/PLATELET
Basophils Absolute: 0 10*3/uL (ref 0.0–0.1)
Basophils Relative: 0 %
EOS PCT: 0 %
Eosinophils Absolute: 0 10*3/uL (ref 0.0–0.7)
HCT: 25.2 % — ABNORMAL LOW (ref 36.0–46.0)
HEMOGLOBIN: 7.4 g/dL — AB (ref 12.0–15.0)
LYMPHS ABS: 2.7 10*3/uL (ref 0.7–4.0)
LYMPHS PCT: 19 %
MCH: 28.6 pg (ref 26.0–34.0)
MCHC: 29.4 g/dL — AB (ref 30.0–36.0)
MCV: 97.3 fL (ref 78.0–100.0)
Monocytes Absolute: 1.2 10*3/uL — ABNORMAL HIGH (ref 0.1–1.0)
Monocytes Relative: 8 %
Neutro Abs: 10.5 10*3/uL — ABNORMAL HIGH (ref 1.7–7.7)
Neutrophils Relative %: 73 %
Platelets: 143 10*3/uL — ABNORMAL LOW (ref 150–400)
RBC: 2.59 MIL/uL — AB (ref 3.87–5.11)
RDW: 13.4 % (ref 11.5–15.5)
WBC: 14.5 10*3/uL — AB (ref 4.0–10.5)

## 2016-11-09 LAB — HIV ANTIBODY (ROUTINE TESTING W REFLEX): HIV Screen 4th Generation wRfx: NONREACTIVE

## 2016-11-09 LAB — BASIC METABOLIC PANEL
ANION GAP: 5 (ref 5–15)
BUN: 29 mg/dL — ABNORMAL HIGH (ref 6–20)
CALCIUM: 8.7 mg/dL — AB (ref 8.9–10.3)
CO2: 25 mmol/L (ref 22–32)
CREATININE: 1.11 mg/dL — AB (ref 0.44–1.00)
Chloride: 122 mmol/L — ABNORMAL HIGH (ref 101–111)
GFR, EST AFRICAN AMERICAN: 59 mL/min — AB (ref >60)
GFR, EST NON AFRICAN AMERICAN: 51 mL/min — AB (ref >60)
Glucose, Bld: 131 mg/dL — ABNORMAL HIGH (ref 65–99)
Potassium: 4 mmol/L (ref 3.5–5.1)
SODIUM: 152 mmol/L — AB (ref 135–145)

## 2016-11-09 LAB — LACTIC ACID, PLASMA
LACTIC ACID, VENOUS: 1.6 mmol/L (ref 0.5–1.9)
Lactic Acid, Venous: 1.3 mmol/L (ref 0.5–1.9)

## 2016-11-09 LAB — PROTIME-INR
INR: 1.38
Prothrombin Time: 17.1 seconds — ABNORMAL HIGH (ref 11.4–15.2)

## 2016-11-09 LAB — CORTISOL: CORTISOL PLASMA: 16.9 ug/dL

## 2016-11-09 LAB — GLUCOSE, CAPILLARY: GLUCOSE-CAPILLARY: 130 mg/dL — AB (ref 65–99)

## 2016-11-09 LAB — MRSA PCR SCREENING: MRSA BY PCR: NEGATIVE

## 2016-11-09 LAB — PROCALCITONIN: PROCALCITONIN: 0.23 ng/mL

## 2016-11-09 LAB — URINE CULTURE: Culture: NO GROWTH

## 2016-11-09 LAB — TSH: TSH: 1.543 u[IU]/mL (ref 0.350–4.500)

## 2016-11-09 LAB — APTT: aPTT: 26 seconds (ref 24–36)

## 2016-11-09 MED ORDER — HYDROCORTISONE NA SUCCINATE PF 100 MG IJ SOLR
100.0000 mg | Freq: Three times a day (TID) | INTRAMUSCULAR | Status: DC
  Administered 2016-11-09 – 2016-11-12 (×9): 100 mg via INTRAVENOUS
  Filled 2016-11-09 (×9): qty 2

## 2016-11-09 MED ORDER — DEXTROSE 5 % IV SOLN
INTRAVENOUS | Status: DC
  Administered 2016-11-09 – 2016-11-13 (×6): via INTRAVENOUS

## 2016-11-09 MED ORDER — LORAZEPAM 2 MG/ML IJ SOLN
INTRAMUSCULAR | Status: AC
  Filled 2016-11-09: qty 1

## 2016-11-09 MED ORDER — ENSURE ENLIVE PO LIQD
237.0000 mL | Freq: Two times a day (BID) | ORAL | Status: DC
  Administered 2016-11-09 – 2016-11-13 (×7): 237 mL via ORAL

## 2016-11-09 MED ORDER — LORAZEPAM 2 MG/ML IJ SOLN
2.0000 mg | Freq: Once | INTRAMUSCULAR | Status: AC
  Administered 2016-11-09: 2 mg via INTRAVENOUS

## 2016-11-09 NOTE — Consult Note (Signed)
Consultation Note Date: 11/09/2016   Patient Name: Cheyenne Marsh  DOB: 05-Dec-1950  MRN: [id number]  Age / Sex: 66 y.o., female  PCP: Patient, No Pcp Per Referring Physician: [name], MD  Reason for Consultation: Establishing goals of care  HPI/Patient Profile: 66 yo female with past medical history of cerebral palsy and CVA admitted on 11/08/2016 from home where her sister cares for her with generalized weakness with recent falls, decreased responsiveness, and decreased oral intake. Family has noted progressive decline over past 3 years. 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 Neurosurgery to see but surgery thought to be risky.    Clinical Assessment and Goals of Care: I met today with Cheyenne Marsh (who Marsh mostly sleeping) and sister/caregiver, Cheyenne Marsh. Cheyenne Marsh very emotional as she has just spoken with Dr. Laverle Patter about Cheyenne Marsh's MRI results. Cheyenne Marsh very emotional and blaming herself for not being attentive and more proactive about Cheyenne Marsh's declining functional status at home. I spent some time encouraging Cheyenne Marsh about the excellent care that she has provided Cheyenne Marsh and that this Marsh a complication that could not be avoided and it Marsh not her fault. Emotional support and listening provided.   We also discussed where we should go from here. Cheyenne Marsh says that her daughter Marsh Cheyenne Marsh's legal guardian and she call's daughter and we agreed to meet all together tomorrow to discuss Cheyenne Marsh's situation and decisions moving forward.    Primary Decision Maker LEGAL GUARDIAN niece Cheyenne Marsh    SUMMARY OF RECOMMENDATIONS   - To meet tomorrow for further GOC - Caregiver/sister says that she support DNR, has more questions about potential feeding tube, concerned about caring for her sister moving forward  Code Status/Advance Care Planning:  DNR   Symptom  Management:   Monitor closely for discomfort. Difficult to discern given communication barriers. She Marsh able to read and used to love to write.   Provide nonverbal reassurance and encouraged family to bring items of comfort and familiarity from home.   Palliative Prophylaxis:   Aspiration, Bowel Regimen, Delirium Protocol, Frequent Pain Assessment, Oral Care and Turn Reposition   Psycho-social/Spiritual:   Desire for further Chaplaincy support:no  Additional Recommendations: Caregiving  Support/Resources and Education on Hospice  Prognosis:   < 6 months likely with progressive cord compression and functioning status.   Discharge Planning: Likely home with hospice support.      Primary Diagnoses: Present on Admission: . Sepsis ([place])   I have reviewed the medical record, interviewed the patient and family, and examined the patient. The following aspects are pertinent.  Past Medical History:  Diagnosis Date  . Cerebral palsy ([place])   . Stroke [facility])    Social History   Social History  . Marital status: Single    Spouse name: N/A  . Number of children: N/A  . Years of education: N/A   Social History Main Topics  . Smoking status: Never Smoker  . Smokeless tobacco: Never Used  . Alcohol use No  .  Drug use: No  . Sexual activity: No   Other Topics Concern  . None   Social History Narrative  . None   History reviewed. No pertinent family history. Scheduled Meds: . cholecalciferol  1,000 Units Oral Daily  . donepezil  5 mg Oral QHS  . feeding supplement (ENSURE ENLIVE)  237 mL Oral BID BM  . hydrocortisone sod succinate (SOLU-CORTEF) inj  100 mg Intravenous Q8H  . LORazepam      . multivitamin with minerals  1 tablet Oral Daily  . sodium chloride flush  3 mL Intravenous Q12H  . sodium chloride flush  3 mL Intravenous Q12H  . thioridazine  50 mg Oral BID   Continuous Infusions: . sodium chloride    . cefTRIAXone (ROCEPHIN)  IV 1 g (11/09/16 1444)  .  dextrose 100 mL/hr at 11/09/16 1052   PRN Meds:.sodium chloride, acetaminophen **OR** acetaminophen, bisacodyl, ondansetron **OR** ondansetron (ZOFRAN) IV, polyethylene glycol, sodium chloride flush No Known Allergies Review of Systems  Physical Exam  Constitutional: She appears well-developed.  HENT:  Head: Normocephalic and atraumatic.  Cardiovascular: Normal rate and regular rhythm.   Pulmonary/Chest: Effort normal. No accessory muscle usage. No tachypnea. No respiratory distress.  Abdominal: Soft. Normal appearance.  Neurological: She Marsh lethargic and minimally responsive.   Vital Signs: BP 115/62   Pulse 97   Temp 99.2 F (37.3 C) (Oral)   Resp (!) 22   Ht _0  (1.6 m)   Wt 57.6 kg (126 lb 14.4 oz)   SpO2 99%   BMI 22.48 kg/m  Pain Assessment: PAINAD   Pain Score: Asleep   SpO2: SpO2: 99 % O2 Device:SpO2: 99 % O2 Flow Rate: .   IO: Intake/output summary:  Intake/Output Summary (Last 24 hours) at 11/09/16 1624 Last data filed at 11/09/16 1131  Gross per 24 hour  Intake          1082.08 ml  Output              100 ml  Net           982.08 ml    LBM: Last BM Date: 11/07/16 Baseline Weight: Weight: 59 kg (130 lb) Most recent weight: Weight: 57.6 kg (126 lb 14.4 oz)     Palliative Assessment/Data: 20%    Time Total: 72mn  Greater than 50%  of this time was spent counseling and coordinating care related to the above assessment and plan.  Signed by: AVinie Sill NP Palliative Medicine Team Pager # [phone](M-F 8a-5p) Team Phone # [phone](Nights/Weekends)

## 2016-11-09 NOTE — Plan of Care (Signed)
Problem: Education: Goal: Knowledge of [place] [facility] Education information/materials will improve Outcome: Progressing Information discussed and shared with patient daughter Dennie Bibleat  Problem: Pain Managment: Goal: General experience of comfort will improve Outcome: Progressing Turn patient every 2 hours  Problem: Physical Regulation: Goal: Will remain free from infection Outcome: Progressing Will continue to assess, blood and urine cultures collected   Problem: Skin Integrity: Goal: Risk for impaired skin integrity will decrease Outcome: Not Progressing Admitted with pressure ulcers, will continue to turn assess, foam dressing applied   Problem: Activity: Goal: Risk for activity intolerance will decrease Outcome: Not Progressing History of CP, non mobile prior to admit.

## 2016-11-09 NOTE — Progress Notes (Addendum)
PROGRESS NOTE                                                                                                                                                                                                             Patient Demographics:    Avyonna Wagoner, is a 66 y.o. female, DOB - 07-03-1950, UXL:[id number]  Admit date - 11/08/2016   Admitting Physician Jackie Plum, MD  Outpatient Primary MD for the patient is Patient, No Pcp Per  LOS - 1  Outpatient Specialists: none  Chief Complaint  Patient presents with  . Weakness       Brief Narrative   66 year old female with medical history of cerebral palsy and CVA. She has had progressive symptoms of her cerebral palsy and is being cared by her sister 24/7. Patient brought to the ED for progressive generalized weakness. Also has poor oral intake for past several weeks. Patient has shown a decline in her function since 2014 and has been fully dependent of her ADLs. Recently she also has been having multiple falls (mainly when she is sitting on the edge of the bed or on a commode).  In the ED she was tachycardic, hypotensive and clinically appeared dehydrated. Head and cervical spine showed significant narrowing at the level of C2. She was also found to be septic with fever of 100.90 Fahrenheit, WBC 14.5 K, lactic acid 2.04. UA was positive for UTI. Chemistry showed sodium of 156, chloride 122,, BUN 48 and creatinine 2.38. Admitted to stepdown unit .    Subjective:   Pt nonverbal. BP low normal.   Assessment  & Plan :    Principal Problem:   Sepsis (HCC) Possibly due to UTI and severe dehydration. Blood pressure still low normal this morning. Fluids switch to D5. Continue empiric Rocephin. Follow urine and blood culture. Continue step down monitoring.   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 Has chronic platyspondyly.  Discussed MRI results including old cervical spine CT from 2010 with the radiologist. Appears that these findings are progressive over time and severe spinal cord compression may not be acute. Recent frequent falls may be contributed by her spinal stenosis.  Patient is nonambulatory and needs assistance  in all her ADLs. I discussed with patient's sister at bedside that surgical correction may not improve her quality of life or functional level.   Discussed at length with pts niece who is her guardian. She understands patient's condition has gradually declined over time. She does not want any heroic measures including artificial life support, chest compression or aggressive cardiac resuscitation. She is unsure about feeding tube at this time is not inclined towards it. She agrees and patient has been made DO NOT RESUSCITATE.  Spoke with neurosurgery on call Dr Newell CoralNudelman. He reviewed the images and fused that she is a fairly high risk and may or may not regain her functional capacity (patient already has functional quadriplegia). He will ask Dr. Bevely Palmeritty to evaluate tomorrow and give his recommendations.   Palliative care has been consulted for further goals of care discussion.  Acute kidney injury Secondary to sepsis and severe dehydration. Slowly improving with IV fluids.  Severe hypernatremia Secondary to dehydration. Switch fluids to D5. Monitor every 8 hours. Avoid overcorrection.  Anemia Drop in hemoglobin from admission (i suspect hb on admission was higher due to dehydration). Monitor for now.  Hypotension Possibly due to sepsis. Will place on stress dose hydrocortisone.    Cerebral palsy (HCC)    Pressure injury of skin Care per nursing.  Hx of CVA  hold plavix, continue statin.    Code Status : full code  Family Communication  : sister at  Bedside  Disposition Plan  : pending hospital course.  Barriers For Discharge : active symptoms  Consults  :   Palliative care  Procedures  :  CT/ MRI  Cervical spine   DVT Prophylaxis  :  Lovenox -   Lab Results  Component Value Date   PLT 143 (L) 11/09/2016    Antibiotics  :    Anti-infectives    Start     Dose/Rate Route Frequency Ordered Stop   11/09/16 1400  cefTRIAXone (ROCEPHIN) 1 g in dextrose 5 % 50 mL IVPB     1 g 100 mL/hr over 30 Minutes Intravenous Every 24 hours 11/08/16 1413     11/08/16 1415  cefTRIAXone (ROCEPHIN) 1 g in dextrose 5 % 50 mL IVPB     1 g 100 mL/hr over 30 Minutes Intravenous  Once 11/08/16 1404 11/08/16 1513        Objective:   Vitals:   11/09/16 0500 11/09/16 0510 11/09/16 0800 11/09/16 1131  BP: 90/61 (!) 89/73 (!) 83/60 101/68  Pulse: 89 88 91 94  Resp: (!) 22 (!) 22 13 19   Temp:  97.5 F (36.4 C) 98.1 F (36.7 C) 97.4 F (36.3 C)  TempSrc:  Oral Oral Oral  SpO2: 100% 99% 100% 100%  Weight:      Height:        Wt Readings from Last 3 Encounters:  11/09/16 57.6 kg (126 lb 14.4 oz)  12/06/15 59 kg (130 lb)     Intake/Output Summary (Last 24 hours) at 11/09/16 1414 Last data filed at 11/09/16 1131  Gross per 24 hour  Intake          2082.08 ml  Output              100 ml  Net          1982.08 ml     Physical Exam Gen.: Appears fatigued, nonverbal, HEENT: Temporal wasting, dry oral mucosa, cervical collar in place Chest: Clear bilaterally CVS: Normal S1 and S2, no murmurs  rub or gallop GI: Soft, nondistended, nontender, bowel sounds present Musculoskeletal: Poor mobility of right arm and leg, right hand appears contracted. CNS: Awake, nonverbal, responds to simple commands.    Data Review:    CBC  Recent Labs Lab 11/08/16 1302 11/09/16 0329  WBC 17.6* 14.5*  HGB 10.8* 7.4*  HCT 35.4* 25.2*  PLT 203 143*  MCV 96.7 97.3  MCH 29.5 28.6  MCHC 30.5 29.4*  RDW 13.3 13.4  LYMPHSABS 1.7 2.7  MONOABS  0.8 1.2*  EOSABS 0.0 0.0  BASOSABS 0.0 0.0    Chemistries   Recent Labs Lab 11/08/16 1302 11/09/16 0329  NA 156* 154*  K 5.0 4.0  CL 122* 125*  CO2 23 25  GLUCOSE 152* 155*  BUN 48* 36*  CREATININE 2.38* 1.35*  CALCIUM 9.5 8.2*  AST 24 20  ALT 19 14  ALKPHOS 65 54  BILITOT 0.4 0.4   ------------------------------------------------------------------------------------------------------------------ No results for input(s): CHOL, HDL, LDLCALC, TRIG, CHOLHDL, LDLDIRECT in the last 72 hours.  No results found for: HGBA1C ------------------------------------------------------------------------------------------------------------------  Recent Labs  11/09/16 0329  TSH 1.543   ------------------------------------------------------------------------------------------------------------------ No results for input(s): VITAMINB12, FOLATE, FERRITIN, TIBC, IRON, RETICCTPCT in the last 72 hours.  Coagulation profile  Recent Labs Lab 11/09/16 0329  INR 1.38    No results for input(s): DDIMER in the last 72 hours.  Cardiac Enzymes No results for input(s): CKMB, TROPONINI, MYOGLOBIN in the last 168 hours.  Invalid input(s): CK ------------------------------------------------------------------------------------------------------------------ No results found for: BNP  Inpatient Medications  Scheduled Meds: . cholecalciferol  1,000 Units Oral Daily  . clopidogrel  75 mg Oral Q breakfast  . donepezil  5 mg Oral QHS  . feeding supplement (ENSURE ENLIVE)  237 mL Oral BID BM  . LORazepam      . LORazepam  2 mg Intravenous Once  . multivitamin with minerals  1 tablet Oral Daily  . sodium chloride flush  3 mL Intravenous Q12H  . sodium chloride flush  3 mL Intravenous Q12H  . thioridazine  50 mg Oral BID   Continuous Infusions: . sodium chloride    . cefTRIAXone (ROCEPHIN)  IV    . dextrose 100 mL/hr at 11/09/16 1052   PRN Meds:.sodium chloride, acetaminophen **OR**  acetaminophen, bisacodyl, ondansetron **OR** ondansetron (ZOFRAN) IV, polyethylene glycol, sodium chloride flush  Micro Results Recent Results (from the past 240 hour(s))  Urine culture     Status: None   Collection Time: 11/08/16 12:52 PM  Result Value Ref Range Status   Specimen Description URINE, CATHETERIZED  Final   Special Requests NONE  Final   Culture   Final    NO GROWTH Performed at [facility], [address] [place], [place] [address]    Report Status 11/09/2016 FINAL  Final  MRSA PCR Screening     Status: None   Collection Time: 11/09/16 12:34 AM  Result Value Ref Range Status   MRSA by PCR NEGATIVE NEGATIVE Final    Comment:        The GeneXpert MRSA Assay (FDA approved for NASAL specimens only), is one component of a comprehensive MRSA colonization surveillance program. It is not intended to diagnose MRSA infection nor to guide or monitor treatment for MRSA infections.     Radiology Reports Ct Head Wo Contrast  Result Date: 11/08/2016 CLINICAL DATA:  Multiple recent falls.  Decreased mental status. EXAM: CT HEAD WITHOUT CONTRAST CT MAXILLOFACIAL WITHOUT CONTRAST CT CERVICAL SPINE WITHOUT CONTRAST TECHNIQUE: Multidetector CT imaging of  the head, cervical spine, and maxillofacial structures were performed using the standard protocol without intravenous contrast. Multiplanar CT image reconstructions of the cervical spine and maxillofacial structures were also generated. COMPARISON:  August 27, 2008 FINDINGS: CT HEAD FINDINGS Brain: No subdural, epidural, or subarachnoid hemorrhage. Cerebellum, brainstem, and basal cisterns are normal. Encephalomalacia is seen in the right frontal lobe, unchanged since 2010, consistent with remote infarct. No acute cortical ischemia or infarct is identified. The ventricles and sulci are unchanged within visualize limits. No mass effect or midline shift. Vascular: No hyperdense vessel or unexpected calcification. Skull: Normal.  Negative for fracture or focal lesion. Other: None. CT MAXILLOFACIAL FINDINGS Osseous: No fracture or mandibular dislocation. No destructive process. Orbits: Negative. No traumatic or inflammatory finding. Sinuses: Clear. Soft tissues: Negative. CT CERVICAL SPINE FINDINGS Alignment: The pre odontoid space is more narrow in the interval, due to increasing hypertrophy of the anterior arch of C1. There is straightening of normal lordosis which is similar in the interval. The right lateral mass of C1 appears to have slipped more anteriorly versus C2 in the interval. There was some anterolisthesis in this region previously but the listhesis measures 7 mm on series 19, image 19 today versus 3.7 mm previously. There is no significant anterolisthesis on the left at this level. No fractures are seen. Skull base and vertebrae: No fractures are identified. Soft tissues and spinal canal: No prevertebral soft tissue swelling identified. There is increased attenuation in the fat at the base of the neck on the right such as on series 17, image 17. 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 Increased canal narrowing is seen at the level of the odontoid process due to increased anterolisthesis of C1 versus C2 on the right. The canal measures 6.8 mm at this level. The diameter of the canal is otherwise similar in the interval. Disc levels: Severe multilevel degenerative changes with anterior osteophytes. Upper chest: Negative. Other: No other abnormalities. IMPRESSION: 1. No fracture identified in the cervical spine. However, C1 appears to be anterolisthesed versus C2, particularly on the right. While this was present previously, it has increased in the interval and canal narrowing at the level of the odontoid process has significantly worsened in the interval. The canal measures 6.8 mm at this level today. An MRI could better assess the cord at this level. Pressure on the cord at this level could result in the reported symptoms  of decreased ability to perform ADL's. I suspect this is most likely due to degenerative change or ligamentous laxity rather than traumatic change. 2. Chronic right frontal infarct. No acute intracranial abnormality. 3. No facial bone fracture identified. 4. Increased attenuation in the fat at the base of neck on the right is likely posttraumatic given history. No large hematoma seen in this region. No fractures seen in the adjacent bones. CT imaging in this region could further assess if clinically warranted. Findings called to Dr. Rhunette Croft. Electronically Signed   By: Gerome Sam III M.D   On: 11/08/2016 14:57   Ct Cervical Spine Wo Contrast  Result Date: 11/08/2016 CLINICAL DATA:  Multiple recent falls.  Decreased mental status. EXAM: CT HEAD WITHOUT CONTRAST CT MAXILLOFACIAL WITHOUT CONTRAST CT CERVICAL SPINE WITHOUT CONTRAST TECHNIQUE: Multidetector CT imaging of the head, cervical spine, and maxillofacial structures were performed using the standard protocol without intravenous contrast. Multiplanar CT image reconstructions of the cervical spine and maxillofacial structures were also generated. COMPARISON:  August 27, 2008 FINDINGS: CT HEAD FINDINGS Brain: No subdural, epidural, or subarachnoid  hemorrhage. Cerebellum, brainstem, and basal cisterns are normal. Encephalomalacia is seen in the right frontal lobe, unchanged since 2010, consistent with remote infarct. No acute cortical ischemia or infarct is identified. The ventricles and sulci are unchanged within visualize limits. No mass effect or midline shift. Vascular: No hyperdense vessel or unexpected calcification. Skull: Normal. Negative for fracture or focal lesion. Other: None. CT MAXILLOFACIAL FINDINGS Osseous: No fracture or mandibular dislocation. No destructive process. Orbits: Negative. No traumatic or inflammatory finding. Sinuses: Clear. Soft tissues: Negative. CT CERVICAL SPINE FINDINGS Alignment: The pre odontoid space is more narrow in  the interval, due to increasing hypertrophy of the anterior arch of C1. There is straightening of normal lordosis which is similar in the interval. The right lateral mass of C1 appears to have slipped more anteriorly versus C2 in the interval. There was some anterolisthesis in this region previously but the listhesis measures 7 mm on series 19, image 19 today versus 3.7 mm previously. There is no significant anterolisthesis on the left at this level. No fractures are seen. Skull base and vertebrae: No fractures are identified. Soft tissues and spinal canal: No prevertebral soft tissue swelling identified. There is increased attenuation in the fat at the base of the neck on the right such as on series 17, image 17. 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 Increased canal narrowing is seen at the level of the odontoid process due to increased anterolisthesis of C1 versus C2 on the right. The canal measures 6.8 mm at this level. The diameter of the canal is otherwise similar in the interval. Disc levels: Severe multilevel degenerative changes with anterior osteophytes. Upper chest: Negative. Other: No other abnormalities. IMPRESSION: 1. No fracture identified in the cervical spine. However, C1 appears to be anterolisthesed versus C2, particularly on the right. While this was present previously, it has increased in the interval and canal narrowing at the level of the odontoid process has significantly worsened in the interval. The canal measures 6.8 mm at this level today. An MRI could better assess the cord at this level. Pressure on the cord at this level could result in the reported symptoms of decreased ability to perform ADL's. I suspect this is most likely due to degenerative change or ligamentous laxity rather than traumatic change. 2. Chronic right frontal infarct. No acute intracranial abnormality. 3. No facial bone fracture identified. 4. Increased attenuation in the fat at the base of neck on the right  is likely posttraumatic given history. No large hematoma seen in this region. No fractures seen in the adjacent bones. CT imaging in this region could further assess if clinically warranted. Findings called to Dr. Rhunette Croft. Electronically Signed   By: Gerome Sam III M.D   On: 11/08/2016 14:57   Mr Cervical Spine Wo Contrast  Result Date: 11/09/2016 CLINICAL DATA:  66 year old female with cerebral palsy. Progressive worsening generalized weakness. Abnormal upper cervical spine on recent CT. EXAM: MRI CERVICAL SPINE WITHOUT CONTRAST TECHNIQUE: Multiplanar, multisequence MR imaging of the cervical spine was performed. No intravenous contrast was administered. COMPARISON:  CT head face and cervical spine 11/08/2016. FINDINGS: Study is moderately degraded by motion artifact despite repeated imaging attempts. Alignment: Stable from the recent CT. Straightening of cervical lordosis. Anterior positioning of C1 relative to C2, which appears to be multifactorial (see the next section). The atlanto dens interval appears relatively maintained as seen by CT. No other spondylolisthesis. Cervicothoracic junction alignment is within normal limits. Vertebrae: Platyspondyly. Superimposed very bulky anterior endplate osteophytes throughout the  cervical spine. Superimposed bulky bony hypertrophy of the odontoid. No marrow edema or acute osseous abnormality identified. Visualized bone marrow signal is within normal limits. Cord: Abnormal spinal cord signal at the cervicomedullary junction/C1 (series 13, image 8). 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 Between the C2 and C6 levels spinal cord volume, signal, and morphology appears maintained. Abnormal T2 and STIR cord signal then at the C6-C7 level (series 13, image 9 and series 18, image 25. From the inferior C7 level into the upper thoracic spine cord signal and morphology appears maintained. Posterior Fossa, vertebral arteries, paraspinal tissues:  Negative visualized brain parenchyma. Preserved carotid artery flow voids in the neck. No signal abnormality in the cervical spine ligamentous complex. No acute neck soft tissue findings are evident. Disc levels: Axial images are significantly motion degraded such that cervical degeneration was much better depicted on the recent CT. There is moderate to severe multifactorial spinal stenosis at the C1-odontoid level, related to the vertebral body morphology, subluxation, odontoid hypertrophy, and posterior ligamentous hypertrophy. Mild cervical spinal stenosis also at C2-C3, with up to severe degenerative foraminal stenosis worse on the right. No associated spinal cord mass effect. No other significant cervical spinal stenosis. IMPRESSION: 1. 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 2. Multifactorial moderate to severe spinal stenosis at the C1-odontoid level with spinal cord compression and associated C1 level myelomalacia. 3. 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 Electronically Signed   By: Odessa Fleming M.D.   On: 11/09/2016 14:00   Dg Chest Port 1 View  Result Date: 11/08/2016 CLINICAL DATA:  History of cerebral palsy.  Nonverbal patient. EXAM: PORTABLE CHEST 1 VIEW COMPARISON:  06/17/2013 FINDINGS: Cardiomediastinal silhouette is normal. Mediastinal contours appear intact. There is no evidence of focal airspace consolidation, pleural effusion or pneumothorax. Bronchiectasis with mild peribronchial thickening in the right upper lobe. Osseous structures are without acute abnormality. Soft tissues are grossly normal. IMPRESSION: Bronchiectasis with mild peribronchial thickening in the right upper lobe. Electronically Signed   By: Ted Mcalpine M.D.   On: 11/08/2016 13:34   Ct Maxillofacial Wo Contrast  Result Date: 11/08/2016 CLINICAL DATA:  Multiple recent falls.  Decreased mental status. EXAM: CT HEAD  WITHOUT CONTRAST CT MAXILLOFACIAL WITHOUT CONTRAST CT CERVICAL SPINE WITHOUT CONTRAST TECHNIQUE: Multidetector CT imaging of the head, cervical spine, and maxillofacial structures were performed using the standard protocol without intravenous contrast. Multiplanar CT image reconstructions of the cervical spine and maxillofacial structures were also generated. COMPARISON:  August 27, 2008 FINDINGS: CT HEAD FINDINGS Brain: No subdural, epidural, or subarachnoid hemorrhage. Cerebellum, brainstem, and basal cisterns are normal. Encephalomalacia is seen in the right frontal lobe, unchanged since 2010, consistent with remote infarct. No acute cortical ischemia or infarct is identified. The ventricles and sulci are unchanged within visualize limits. No mass effect or midline shift. Vascular: No hyperdense vessel or unexpected calcification. Skull: Normal. Negative for fracture or focal lesion. Other: None. CT MAXILLOFACIAL FINDINGS Osseous: No fracture or mandibular dislocation. No destructive process. Orbits: Negative. No traumatic or inflammatory finding. Sinuses: Clear. Soft tissues: Negative. CT CERVICAL SPINE FINDINGS Alignment: The pre odontoid space is more narrow in the interval, due to increasing hypertrophy of the anterior arch of C1. There is straightening of normal lordosis which is similar in the interval. The right lateral mass of C1 appears to have slipped more anteriorly versus C2 in the interval. There was some anterolisthesis in this region previously but the listhesis measures  7 mm on series 19, image 19 today versus 3.7 mm previously. There is no significant anterolisthesis on the left at this level. No fractures are seen. Skull base and vertebrae: No fractures are identified. Soft tissues and spinal canal: No prevertebral soft tissue swelling identified. There is increased attenuation in the fat at the base of the neck on the right such as on series 17, image 17. Congenital canal stenosis is again  identified. Increased canal narrowing is seen at the level of the odontoid process due to increased anterolisthesis of C1 versus C2 on the right. The canal measures 6.8 mm at this level. The diameter of the canal is otherwise similar in the interval. Disc levels: Severe multilevel degenerative changes with anterior osteophytes. Upper chest: Negative. Other: No other abnormalities. IMPRESSION: 1. No fracture identified in the cervical spine. However, C1 appears to be anterolisthesed versus C2, particularly on the right. While this was present previously, it has increased in the interval and canal narrowing at the level of the odontoid process has significantly worsened in the interval. The canal measures 6.8 mm at this level today. An MRI could better assess the cord at this level. Pressure on the cord at this level could result in the reported symptoms of decreased ability to perform ADL's. I suspect this is most likely due to degenerative change or ligamentous laxity rather than traumatic change. 2. Chronic right frontal infarct. No acute intracranial abnormality. 3. No facial bone fracture identified. 4. Increased attenuation in the fat at the base of neck on the right is likely posttraumatic given history. No large hematoma seen in this region. No fractures seen in the adjacent bones. CT imaging in this region could further assess if clinically warranted. Findings called to Dr. Rhunette Croft. Electronically Signed   By: Gerome Sam III M.D   On: 11/08/2016 14:57    Time Spent in minutes  35   Eddie North M.D on 11/09/2016 at 2:14 PM  Between 7am to 7pm - Pager - [phone]  After 7pm go to [email or web address] - password [facility]  [facility] -  Office  [phone]

## 2016-11-10 DIAGNOSIS — G9520 Unspecified cord compression: Secondary | ICD-10-CM

## 2016-11-10 DIAGNOSIS — R627 Adult failure to thrive: Secondary | ICD-10-CM

## 2016-11-10 DIAGNOSIS — Z7189 Other specified counseling: Secondary | ICD-10-CM

## 2016-11-10 DIAGNOSIS — N3001 Acute cystitis with hematuria: Secondary | ICD-10-CM

## 2016-11-10 DIAGNOSIS — Z515 Encounter for palliative care: Secondary | ICD-10-CM

## 2016-11-10 LAB — BASIC METABOLIC PANEL
ANION GAP: 5 (ref 5–15)
BUN: 27 mg/dL — ABNORMAL HIGH (ref 6–20)
CALCIUM: 8.7 mg/dL — AB (ref 8.9–10.3)
CO2: 23 mmol/L (ref 22–32)
CREATININE: 0.96 mg/dL (ref 0.44–1.00)
Chloride: 120 mmol/L — ABNORMAL HIGH (ref 101–111)
Glucose, Bld: 155 mg/dL — ABNORMAL HIGH (ref 65–99)
Potassium: 4.1 mmol/L (ref 3.5–5.1)
SODIUM: 148 mmol/L — AB (ref 135–145)

## 2016-11-10 LAB — GLUCOSE, CAPILLARY: GLUCOSE-CAPILLARY: 131 mg/dL — AB (ref 65–99)

## 2016-11-10 MED ORDER — THIORIDAZINE HCL 50 MG PO TABS
50.0000 mg | ORAL_TABLET | Freq: Two times a day (BID) | ORAL | Status: DC
  Administered 2016-11-10 – 2016-11-13 (×6): 50 mg via ORAL
  Filled 2016-11-10 (×7): qty 1

## 2016-11-10 NOTE — Consult Note (Signed)
I had a discussion with the family about the patient's C1 stenosis.  They have already decided they would not be interested in surgery.  This is a reasonable decision; I would make the same decision.  I answered their questions.  No follow up required.

## 2016-11-10 NOTE — Progress Notes (Signed)
Pt arrived from 65M via bed.  Pt alert, nonverbal, VSS.  No family at bedside.  Will continue to monitor.  Sondra ComeSilva, Rejina Odle M, RN

## 2016-11-10 NOTE — Progress Notes (Addendum)
PROGRESS NOTE                                                                                                                                                                                                             Patient Demographics:    Cheyenne Marsh, is a 66 y.o. female, DOB - June 28, 1950, WNU:[id number]  Admit date - 11/08/2016   Admitting Physician Jackie Plum, MD  Outpatient Primary MD for the patient is Patient, No Pcp Per  LOS - 2  Outpatient Specialists: none  Chief Complaint  Patient presents with  . Weakness       Brief Narrative   66 year old female with medical history of cerebral palsy and CVA. She has had progressive symptoms of her cerebral palsy and is being cared by her sister 24/7. Patient brought to the ED for progressive generalized weakness. Also has poor oral intake for past several weeks. Patient has shown a decline in her function since 2014 and has been fully dependent of her ADLs. Recently she also has been having multiple falls (mainly when she is sitting on the edge of the bed or on a commode).  In the ED she was tachycardic, hypotensive and clinically appeared dehydrated. Head and cervical spine showed significant narrowing at the level of C2. She was also found to be septic with fever of 100.90 Fahrenheit, WBC 14.5 K, lactic acid 2.04. UA was positive for UTI. Chemistry showed sodium of 156, chloride 122,, BUN 48 and creatinine 2.38. Admitted to stepdown unit .    Subjective:   Patient is nonverbal and noncommunicative, blood pressure improved with fluids and adding IV hydrocortisone yesterday.   Assessment  & Plan :    Principal Problem:   Sepsis (HCC) Possibly due to UTI and severe dehydration.  Empiric Rocephin. Urine and blood cultures pending. Blood pressure improved with fluids and adding IV hydrocortisone.   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 Has chronic platyspondyly.   Discussed MRI results including old cervical spine CT from 2010 with the radiologist and Dr. Newell Coral.Marland Kitchen Appears that these findings are progressive over time and severe spinal cord compression may not be acute. Recent frequent falls may be contributed by her spinal  stenosis.  Patient is nonambulatory and needs assistance in all her ADLs. I discussed with patient's sister at bedside and her niece who is her legal guardian on the phone that surgical correction may not improve her quality of life or functional level.  Both understand the patient's condition has gradually declined over time, agree with not doing any heroic measures including artificial life-support, chest compression or aggressive cardiac resuscitation. Also understand that she would be a very high risk surgery if decided and possibly will not improve her level of functioning..  Discontinued cervical brace.  Dr Bevely Palmer will evaluate the images discuss with family today. Palliative care  consulted for further goals of care discussion.  Acute kidney injury Secondary to sepsis and severe dehydration. Improved with IV fluids.  Severe hypernatremia Secondary to dehydration. Improving with D5.  Anemia Drop in hemoglobin from admission (i suspect hb on admission was higher due to dehydration). Monitor for now.  Hypotension Possibly due to sepsis. Improved with stress dose steroids.    Cerebral palsy (HCC)    Pressure injury of skin, sacral ulcer ( unstagable) Care per nursing.  Hx of CVA  hold plavix ( if patient was for surgery), continue statin.    Code Status : full code  Family Communication  : None at Bedside  Disposition Plan  : pending hospital course. Transfer to telemetry.  Barriers For Discharge : active symptoms  Consults   :   Palliative care  Procedures  :  CT/ MRI  Cervical spine   DVT Prophylaxis  :  Lovenox -   Lab Results  Component Value Date   PLT 143 (L) 11/09/2016    Antibiotics  :    Anti-infectives    Start     Dose/Rate Route Frequency Ordered Stop   11/09/16 1400  cefTRIAXone (ROCEPHIN) 1 g in dextrose 5 % 50 mL IVPB     1 g 100 mL/hr over 30 Minutes Intravenous Every 24 hours 11/08/16 1413     11/08/16 1415  cefTRIAXone (ROCEPHIN) 1 g in dextrose 5 % 50 mL IVPB     1 g 100 mL/hr over 30 Minutes Intravenous  Once 11/08/16 1404 11/08/16 1513        Objective:   Vitals:   11/09/16 2000 11/09/16 2341 11/10/16 0400 11/10/16 0757  BP: 99/63  113/74 109/70  Pulse: 96 90 91 83  Resp: (!) 24 12 18 13   Temp:  97.7 F (36.5 C) 98 F (36.7 C) 97.8 F (36.6 C)  TempSrc:  Axillary Axillary Oral  SpO2: 96% 100% 100% 100%  Weight:      Height:        Wt Readings from Last 3 Encounters:  11/09/16 57.6 kg (126 lb 14.4 oz)  12/06/15 59 kg (130 lb)     Intake/Output Summary (Last 24 hours) at 11/10/16 1051 Last data filed at 11/10/16 0900  Gross per 24 hour  Intake             1850 ml  Output              100 ml  Net             1750 ml     Physical Exam Gen.: Elderly female appears fatigued, nonverbal,  HEENT: Dry oral mucosa, cervical collar in place Chest: Clear to condition bilaterally CVS: Normal S1 and S2, no murmurs GI: Soft, nondistended, nontender Musculoskeletal: Poor mobility, contacted right hand CNS: Awake, S1 and to few simple commands with  sign language, nonverbal   Data Review:    CBC  Recent Labs Lab 11/08/16 1302 11/09/16 0329  WBC 17.6* 14.5*  HGB 10.8* 7.4*  HCT 35.4* 25.2*  PLT 203 143*  MCV 96.7 97.3  MCH 29.5 28.6  MCHC 30.5 29.4*  RDW 13.3 13.4  LYMPHSABS 1.7 2.7  MONOABS 0.8 1.2*  EOSABS 0.0 0.0  BASOSABS 0.0 0.0    Chemistries   Recent Labs Lab 11/08/16 1302 11/09/16 0329 11/09/16 1738 11/10/16 0205  NA 156* 154* 152*  148*  K 5.0 4.0 4.0 4.1  CL 122* 125* 122* 120*  CO2 23 25 25 23   GLUCOSE 152* 155* 131* 155*  BUN 48* 36* 29* 27*  CREATININE 2.38* 1.35* 1.11* 0.96  CALCIUM 9.5 8.2* 8.7* 8.7*  AST 24 20  --   --   ALT 19 14  --   --   ALKPHOS 65 54  --   --   BILITOT 0.4 0.4  --   --    ------------------------------------------------------------------------------------------------------------------ No results for input(s): CHOL, HDL, LDLCALC, TRIG, CHOLHDL, LDLDIRECT in the last 72 hours.  No results found for: HGBA1C ------------------------------------------------------------------------------------------------------------------  Recent Labs  11/09/16 0329  TSH 1.543   ------------------------------------------------------------------------------------------------------------------ No results for input(s): VITAMINB12, FOLATE, FERRITIN, TIBC, IRON, RETICCTPCT in the last 72 hours.  Coagulation profile  Recent Labs Lab 11/09/16 0329  INR 1.38    No results for input(s): DDIMER in the last 72 hours.  Cardiac Enzymes No results for input(s): CKMB, TROPONINI, MYOGLOBIN in the last 168 hours.  Invalid input(s): CK ------------------------------------------------------------------------------------------------------------------ No results found for: BNP  Inpatient Medications  Scheduled Meds: . cholecalciferol  1,000 Units Oral Daily  . donepezil  5 mg Oral QHS  . feeding supplement (ENSURE ENLIVE)  237 mL Oral BID BM  . hydrocortisone sod succinate (SOLU-CORTEF) inj  100 mg Intravenous Q8H  . multivitamin with minerals  1 tablet Oral Daily  . sodium chloride flush  3 mL Intravenous Q12H  . sodium chloride flush  3 mL Intravenous Q12H  . thioridazine  50 mg Oral BID   Continuous Infusions: . sodium chloride    . cefTRIAXone (ROCEPHIN)  IV 1 g (11/09/16 1444)  . dextrose 100 mL/hr at 11/09/16 2344   PRN Meds:.sodium chloride, acetaminophen **OR** acetaminophen, bisacodyl,  ondansetron **OR** ondansetron (ZOFRAN) IV, polyethylene glycol, sodium chloride flush  Micro Results Recent Results (from the past 240 hour(s))  Urine culture     Status: None   Collection Time: 11/08/16 12:52 PM  Result Value Ref Range Status   Specimen Description URINE, CATHETERIZED  Final   Special Requests NONE  Final   Culture   Final    NO GROWTH Performed at [facility], [address], [place], [place] [address]    Report Status 11/09/2016 FINAL  Final  Blood Culture (routine x 2)     Status: None (Preliminary result)   Collection Time: 11/08/16 12:56 PM  Result Value Ref Range Status   Specimen Description BLOOD RIGHT HAND  Final   Special Requests IN PEDIATRIC BOTTLE Blood Culture adequate volume  Final   Culture   Final    NO GROWTH < 24 HOURS Performed at [facility], [address] [place], [place] [address]    Report Status PENDING  Incomplete  Blood Culture (routine x 2)     Status: None (Preliminary result)   Collection Time: 11/08/16  1:03 PM  Result Value Ref Range Status   Specimen  Description BLOOD LEFT ANTECUBITAL  Final   Special Requests   Final    BOTTLES DRAWN AEROBIC AND ANAEROBIC Blood Culture adequate volume   Culture   Final    NO GROWTH 1 DAY Performed at [facility], [address], [place], [place] [address]    Report Status PENDING  Incomplete  MRSA PCR Screening     Status: None   Collection Time: 11/09/16 12:34 AM  Result Value Ref Range Status   MRSA by PCR NEGATIVE NEGATIVE Final    Comment:        The GeneXpert MRSA Assay (FDA approved for NASAL specimens only), is one component of a comprehensive MRSA colonization surveillance program. It is not intended to diagnose MRSA infection nor to guide or monitor treatment for MRSA infections.     Radiology Reports Ct Head Wo Contrast  Result Date: 11/08/2016 CLINICAL DATA:  Multiple recent falls.  Decreased mental status. EXAM: CT HEAD WITHOUT CONTRAST CT  MAXILLOFACIAL WITHOUT CONTRAST CT CERVICAL SPINE WITHOUT CONTRAST TECHNIQUE: Multidetector CT imaging of the head, cervical spine, and maxillofacial structures were performed using the standard protocol without intravenous contrast. Multiplanar CT image reconstructions of the cervical spine and maxillofacial structures were also generated. COMPARISON:  August 27, 2008 FINDINGS: CT HEAD FINDINGS Brain: No subdural, epidural, or subarachnoid hemorrhage. Cerebellum, brainstem, and basal cisterns are normal. Encephalomalacia is seen in the right frontal lobe, unchanged since 2010, consistent with remote infarct. No acute cortical ischemia or infarct is identified. The ventricles and sulci are unchanged within visualize limits. No mass effect or midline shift. Vascular: No hyperdense vessel or unexpected calcification. Skull: Normal. Negative for fracture or focal lesion. Other: None. CT MAXILLOFACIAL FINDINGS Osseous: No fracture or mandibular dislocation. No destructive process. Orbits: Negative. No traumatic or inflammatory finding. Sinuses: Clear. Soft tissues: Negative. CT CERVICAL SPINE FINDINGS Alignment: The pre odontoid space is more narrow in the interval, due to increasing hypertrophy of the anterior arch of C1. There is straightening of normal lordosis which is similar in the interval. The right lateral mass of C1 appears to have slipped more anteriorly versus C2 in the interval. There was some anterolisthesis in this region previously but the listhesis measures 7 mm on series 19, image 19 today versus 3.7 mm previously. There is no significant anterolisthesis on the left at this level. No fractures are seen. Skull base and vertebrae: No fractures are identified. Soft tissues and spinal canal: No prevertebral soft tissue swelling identified. There is increased attenuation in the fat at the base of the neck on the right such as on series 17, image 17. 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 Increased  canal narrowing is seen at the level of the odontoid process due to increased anterolisthesis of C1 versus C2 on the right. The canal measures 6.8 mm at this level. The diameter of the canal is otherwise similar in the interval. Disc levels: Severe multilevel degenerative changes with anterior osteophytes. Upper chest: Negative. Other: No other abnormalities. IMPRESSION: 1. No fracture identified in the cervical spine. However, C1 appears to be anterolisthesed versus C2, particularly on the right. While this was present previously, it has increased in the interval and canal narrowing at the level of the odontoid process has significantly worsened in the interval. The canal measures 6.8 mm at this level today. An MRI could better assess the cord at this level. Pressure on the cord at this level could result in the reported symptoms of decreased ability to perform ADL's.  I suspect this is most likely due to degenerative change or ligamentous laxity rather than traumatic change. 2. Chronic right frontal infarct. No acute intracranial abnormality. 3. No facial bone fracture identified. 4. Increased attenuation in the fat at the base of neck on the right is likely posttraumatic given history. No large hematoma seen in this region. No fractures seen in the adjacent bones. CT imaging in this region could further assess if clinically warranted. Findings called to Dr. Rhunette CroftNanavati. Electronically Signed   By: Gerome Samavid  Williams III M.D   On: 11/08/2016 14:57   Ct Cervical Spine Wo Contrast  Result Date: 11/08/2016 CLINICAL DATA:  Multiple recent falls.  Decreased mental status. EXAM: CT HEAD WITHOUT CONTRAST CT MAXILLOFACIAL WITHOUT CONTRAST CT CERVICAL SPINE WITHOUT CONTRAST TECHNIQUE: Multidetector CT imaging of the head, cervical spine, and maxillofacial structures were performed using the standard protocol without intravenous contrast. Multiplanar CT image reconstructions of the cervical spine and maxillofacial structures  were also generated. COMPARISON:  August 27, 2008 FINDINGS: CT HEAD FINDINGS Brain: No subdural, epidural, or subarachnoid hemorrhage. Cerebellum, brainstem, and basal cisterns are normal. Encephalomalacia is seen in the right frontal lobe, unchanged since 2010, consistent with remote infarct. No acute cortical ischemia or infarct is identified. The ventricles and sulci are unchanged within visualize limits. No mass effect or midline shift. Vascular: No hyperdense vessel or unexpected calcification. Skull: Normal. Negative for fracture or focal lesion. Other: None. CT MAXILLOFACIAL FINDINGS Osseous: No fracture or mandibular dislocation. No destructive process. Orbits: Negative. No traumatic or inflammatory finding. Sinuses: Clear. Soft tissues: Negative. CT CERVICAL SPINE FINDINGS Alignment: The pre odontoid space is more narrow in the interval, due to increasing hypertrophy of the anterior arch of C1. There is straightening of normal lordosis which is similar in the interval. The right lateral mass of C1 appears to have slipped more anteriorly versus C2 in the interval. There was some anterolisthesis in this region previously but the listhesis measures 7 mm on series 19, image 19 today versus 3.7 mm previously. There is no significant anterolisthesis on the left at this level. No fractures are seen. Skull base and vertebrae: No fractures are identified. Soft tissues and spinal canal: No prevertebral soft tissue swelling identified. There is increased attenuation in the fat at the base of the neck on the right such as on series 17, image 17. 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 Increased canal narrowing is seen at the level of the odontoid process due to increased anterolisthesis of C1 versus C2 on the right. The canal measures 6.8 mm at this level. The diameter of the canal is otherwise similar in the interval. Disc levels: Severe multilevel degenerative changes with anterior osteophytes. Upper chest:  Negative. Other: No other abnormalities. IMPRESSION: 1. No fracture identified in the cervical spine. However, C1 appears to be anterolisthesed versus C2, particularly on the right. While this was present previously, it has increased in the interval and canal narrowing at the level of the odontoid process has significantly worsened in the interval. The canal measures 6.8 mm at this level today. An MRI could better assess the cord at this level. Pressure on the cord at this level could result in the reported symptoms of decreased ability to perform ADL's. I suspect this is most likely due to degenerative change or ligamentous laxity rather than traumatic change. 2. Chronic right frontal infarct. No acute intracranial abnormality. 3. No facial bone fracture identified. 4. Increased attenuation in the fat at the base of neck on the  right is likely posttraumatic given history. No large hematoma seen in this region. No fractures seen in the adjacent bones. CT imaging in this region could further assess if clinically warranted. Findings called to Dr. Rhunette Croft. Electronically Signed   By: Gerome Sam III M.D   On: 11/08/2016 14:57   Mr Cervical Spine Wo Contrast  Addendum Date: 11/09/2016   ADDENDUM REPORT: 11/09/2016 14:25 ADDENDUM: Study discussed by telephone with Dr. Gonzella Lex on 11/09/2016 at 1416 hours. Electronically Signed   By: Odessa Fleming M.D.   On: 11/09/2016 14:25   Result Date: 11/09/2016 CLINICAL DATA:  66 year old female with cerebral palsy. Progressive worsening generalized weakness. Abnormal upper cervical spine on recent CT. EXAM: MRI CERVICAL SPINE WITHOUT CONTRAST TECHNIQUE: Multiplanar, multisequence MR imaging of the cervical spine was performed. No intravenous contrast was administered. COMPARISON:  CT head face and cervical spine 11/08/2016. FINDINGS: Study is moderately degraded by motion artifact despite repeated imaging attempts. Alignment: Stable from the recent CT. Straightening of cervical  lordosis. Anterior positioning of C1 relative to C2, which appears to be multifactorial (see the next section). The atlanto dens interval appears relatively maintained as seen by CT. No other spondylolisthesis. Cervicothoracic junction alignment is within normal limits. Vertebrae: Platyspondyly. Superimposed very bulky anterior endplate osteophytes throughout the cervical spine. Superimposed bulky bony hypertrophy of the odontoid. No marrow edema or acute osseous abnormality identified. Visualized bone marrow signal is within normal limits. Cord: Abnormal spinal cord signal at the cervicomedullary junction/C1 (series 13, image 8). 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 Between the C2 and C6 levels spinal cord volume, signal, and morphology appears maintained. Abnormal T2 and STIR cord signal then at the C6-C7 level (series 13, image 9 and series 18, image 25. From the inferior C7 level into the upper thoracic spine cord signal and morphology appears maintained. Posterior Fossa, vertebral arteries, paraspinal tissues: Negative visualized brain parenchyma. Preserved carotid artery flow voids in the neck. No signal abnormality in the cervical spine ligamentous complex. No acute neck soft tissue findings are evident. Disc levels: Axial images are significantly motion degraded such that cervical degeneration was much better depicted on the recent CT. There is moderate to severe multifactorial spinal stenosis at the C1-odontoid level, related to the vertebral body morphology, subluxation, odontoid hypertrophy, and posterior ligamentous hypertrophy. Mild cervical spinal stenosis also at C2-C3, with up to severe degenerative foraminal stenosis worse on the right. No associated spinal cord mass effect. No other significant cervical spinal stenosis. IMPRESSION: 1. 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 2. Multifactorial  moderate to severe spinal stenosis at the C1-odontoid level with spinal cord compression and associated C1 level myelomalacia. 3. 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 Electronically Signed: By: Odessa Fleming M.D. On: 11/09/2016 14:00   Dg Chest Port 1 View  Result Date: 11/08/2016 CLINICAL DATA:  History of cerebral palsy.  Nonverbal patient. EXAM: PORTABLE CHEST 1 VIEW COMPARISON:  06/17/2013 FINDINGS: Cardiomediastinal silhouette is normal. Mediastinal contours appear intact. There is no evidence of focal airspace consolidation, pleural effusion or pneumothorax. Bronchiectasis with mild peribronchial thickening in the right upper lobe. Osseous structures are without acute abnormality. Soft tissues are grossly normal. IMPRESSION: Bronchiectasis with mild peribronchial thickening in the right upper lobe. Electronically Signed   By: Ted Mcalpine M.D.   On: 11/08/2016 13:34   Ct Maxillofacial Wo Contrast  Result Date: 11/08/2016 CLINICAL DATA:  Multiple recent falls.  Decreased mental status. EXAM: CT  HEAD WITHOUT CONTRAST CT MAXILLOFACIAL WITHOUT CONTRAST CT CERVICAL SPINE WITHOUT CONTRAST TECHNIQUE: Multidetector CT imaging of the head, cervical spine, and maxillofacial structures were performed using the standard protocol without intravenous contrast. Multiplanar CT image reconstructions of the cervical spine and maxillofacial structures were also generated. COMPARISON:  August 27, 2008 FINDINGS: CT HEAD FINDINGS Brain: No subdural, epidural, or subarachnoid hemorrhage. Cerebellum, brainstem, and basal cisterns are normal. Encephalomalacia is seen in the right frontal lobe, unchanged since 2010, consistent with remote infarct. No acute cortical ischemia or infarct is identified. The ventricles and sulci are unchanged within visualize limits. No mass effect or midline shift. Vascular: No hyperdense vessel or unexpected calcification. Skull: Normal. Negative for fracture or focal  lesion. Other: None. CT MAXILLOFACIAL FINDINGS Osseous: No fracture or mandibular dislocation. No destructive process. Orbits: Negative. No traumatic or inflammatory finding. Sinuses: Clear. Soft tissues: Negative. CT CERVICAL SPINE FINDINGS Alignment: The pre odontoid space is more narrow in the interval, due to increasing hypertrophy of the anterior arch of C1. There is straightening of normal lordosis which is similar in the interval. The right lateral mass of C1 appears to have slipped more anteriorly versus C2 in the interval. There was some anterolisthesis in this region previously but the listhesis measures 7 mm on series 19, image 19 today versus 3.7 mm previously. There is no significant anterolisthesis on the left at this level. No fractures are seen. Skull base and vertebrae: No fractures are identified. Soft tissues and spinal canal: No prevertebral soft tissue swelling identified. There is increased attenuation in the fat at the base of the neck on the right such as on series 17, image 17. 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 Increased canal narrowing is seen at the level of the odontoid process due to increased anterolisthesis of C1 versus C2 on the right. The canal measures 6.8 mm at this level. The diameter of the canal is otherwise similar in the interval. Disc levels: Severe multilevel degenerative changes with anterior osteophytes. Upper chest: Negative. Other: No other abnormalities. IMPRESSION: 1. No fracture identified in the cervical spine. However, C1 appears to be anterolisthesed versus C2, particularly on the right. While this was present previously, it has increased in the interval and canal narrowing at the level of the odontoid process has significantly worsened in the interval. The canal measures 6.8 mm at this level today. An MRI could better assess the cord at this level. Pressure on the cord at this level could result in the reported symptoms of decreased ability to perform  ADL's. I suspect this is most likely due to degenerative change or ligamentous laxity rather than traumatic change. 2. Chronic right frontal infarct. No acute intracranial abnormality. 3. No facial bone fracture identified. 4. Increased attenuation in the fat at the base of neck on the right is likely posttraumatic given history. No large hematoma seen in this region. No fractures seen in the adjacent bones. CT imaging in this region could further assess if clinically warranted. Findings called to Dr. Rhunette Croft. Electronically Signed   By: Gerome Sam III M.D   On: 11/08/2016 14:57    Time Spent in minutes  35   Eddie North M.D on 11/10/2016 at 10:51 AM  Between 7am to 7pm - Pager - [phone]  After 7pm go to [email or web address] - password [facility]  [facility] -  Office  [phone]

## 2016-11-10 NOTE — Progress Notes (Signed)
Palliative:  Full note to follow.  I spoke with sister and primary caregiver, Elease Hashimotoatricia, and niece/guardian, Diona Fantiiesha. We had a good discussion regarding [name] decline and current health state. They are more inclined to treat the treatable but not pursue surgery or aggressive measures. DNR confirmed. There has been some mention of artificial feeding if it comes to that - after discussion they seem less inclined to want to pursue this either. Encouraged them that if she likes and will accept a few Ensure a day this is good caloric and protein intake and as good as artificial feeding via tubes.   They are also considering returning home with hospice support with her health changes and main goal is for her to be comfortable and happy. I will continue to follow and discuss. Very supportive family.   Yong ChannelAlicia Taim Wurm, NP Palliative Medicine Team Pager # [phone] (M-F 8a-5p) Team Phone # [phone] (Nights/Weekends)

## 2016-11-10 NOTE — Progress Notes (Signed)
Daily Progress Note   Patient Name: Cheyenne Marsh       Date: 11/10/2016 DOB: Mar 19, 1951  Age: 66 y.o. MRN#: [id number] Attending Physician: Eddie North, MD Primary Care Physician: Patient, No Pcp Per Admit Date: 11/08/2016  Reason for Consultation/Follow-up: Establishing goals of care  Subjective: "Cheyenne Marsh" is more alert today. Communication is difficult as she communicates with a basic form of sign language but now cannot sign herself.   Length of Stay: 2  Current Medications: Scheduled Meds:  . cholecalciferol  1,000 Units Oral Daily  . donepezil  5 mg Oral QHS  . feeding supplement (ENSURE ENLIVE)  237 mL Oral BID BM  . hydrocortisone sod succinate (SOLU-CORTEF) inj  100 mg Intravenous Q8H  . multivitamin with minerals  1 tablet Oral Daily  . sodium chloride flush  3 mL Intravenous Q12H  . sodium chloride flush  3 mL Intravenous Q12H  . thioridazine  50 mg Oral BID    Continuous Infusions: . sodium chloride    . cefTRIAXone (ROCEPHIN)  IV 1 g (11/09/16 1444)  . dextrose 100 mL/hr at 11/09/16 2344    PRN Meds: sodium chloride, acetaminophen **OR** acetaminophen, bisacodyl, ondansetron **OR** ondansetron (ZOFRAN) IV, polyethylene glycol, sodium chloride flush  Physical Exam  Constitutional: She appears well-developed.  HENT:  Head: Normocephalic and atraumatic.  Cardiovascular: Normal rate and regular rhythm.   Pulmonary/Chest: Effort normal. No accessory muscle usage. No tachypnea. No respiratory distress.  Abdominal: Soft. Normal appearance.  Neurological: She is alert.  Difficult to tell orientation  Nursing note and vitals reviewed.           Vital Signs: BP 110/70   Pulse 82   Temp 98.1 F (36.7 C) (Oral)   Resp 20   Ht 5\' 3"  (1.6 m)   Wt 57.6 kg (126 lb  14.4 oz)   SpO2 100%   BMI 22.48 kg/m  SpO2: SpO2: 100 % O2 Device: O2 Device: Not Delivered O2 Flow Rate:    Intake/output summary:  Intake/Output Summary (Last 24 hours) at 11/10/16 1222 Last data filed at 11/10/16 0900  Gross per 24 hour  Intake             1650 ml  Output                0 ml  Net             1650 ml   LBM: Last BM Date: 11/07/16 Baseline Weight: Weight: 59 kg (130 lb) Most recent weight: Weight: 57.6 kg (126 lb 14.4 oz)       Palliative Assessment/Data: 30%     Patient Active Problem List   Diagnosis Date Noted  . Pressure injury of skin 11/09/2016  . Sepsis (HCC) 11/08/2016  . Abnormal CT of spine 11/08/2016  . Acute renal failure (ARF) (HCC) 11/08/2016  . Dehydration 11/08/2016  . Cerebral palsy (HCC) 11/08/2016  . Muscle weakness of extremity 11/08/2016    Palliative Care Assessment & Plan   HPI: 66 yo female with past medical history of cerebral palsy and CVA admitted on 11/08/2016 from home where her sister cares for her with generalized weakness with recent falls, decreased responsiveness, and decreased oral intake. Family has noted progressive decline over past 3 years. 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 Neurosurgery to see but surgery thought to be risky.   Assessment: I spoke with sister and primary caregiver, Cheyenne Hashimotoatricia, and niece/guardian, Cheyenne Marsh. We had a good discussion regarding Cheyenne Marsh's decline and current health state. They are more inclined to treat the treatable but not pursue surgery or aggressive measures. DNR confirmed. There has been some mention of artificial feeding if it comes to that - after discussion they seem less inclined to want to pursue this either. Encouraged them that if she likes and will accept a few Ensure a day this is good caloric and protein intake and as good as artificial feeding via tubes.   They are also considering returning home with hospice support  with her health changes and high risk for acute decline and complications with their main goal is for her to be comfortable and happy. I will continue to follow and discuss. Very supportive family.    Recommendations/Plan:  Monitor closely for discomfort. Difficult to discern given communication barriers. She is able to read and used to love to write.   Provide nonverbal reassurance and encouraged family to bring items of comfort and familiarity from home.   Goals of Care and Additional Recommendations:  Limitations on Scope of Treatment: No Surgical Procedures and No Tracheostomy  Code Status:  DNR  Prognosis:   < 6 months likely with progressive cord compression and functioning status.   Discharge Planning:  Likely home with hospice support.    Thank you for allowing the Palliative Medicine Team to assist in the care of this patient.   Total Time 60min Prolonged Time Billed  no       Greater than 50%  of this time was spent counseling and coordinating care related to the above assessment and plan.  Yong ChannelAlicia Ari Bernabei, NP Palliative Medicine Team Pager # [phone] (M-F 8a-5p) Team Phone # [phone] (Nights/Weekends)

## 2016-11-10 NOTE — Progress Notes (Signed)
Initial Nutrition Assessment  DOCUMENTATION CODES:   Not applicable  INTERVENTION:    Continue Ensure Enlive BID between meals.  Sister to order meals that patient likes and can tolerate.  NUTRITION DIAGNOSIS:   Inadequate oral intake related to poor appetite as evidenced by per patient/family report.  GOAL:   Patient will meet greater than or equal to 90% of their needs  MONITOR:   PO intake, Supplement acceptance, Skin  REASON FOR ASSESSMENT:   Malnutrition Screening Tool    ASSESSMENT:   66 yo female with PMH of cerebral palsy, CVA who was admitted on 6/30 with sepsis, weakness, and poor intake with frequent falls.   Spoke with patient's sister at bedside. She reports that patient has been eating poorly since admission, but usually eats well in the morning. She drinks Ensure supplements at home, usually no more than 2 per day. She enjoys soft fruits, cottage cheese, desserts. Currently on a regular diet with Ensure Enlive BID. Consuming 0-75% of meals. Reviewed hospital menu with sister, so she can order foods that patient enjoys. Nutrition-Focused physical exam completed. Findings are no fat depletion, no muscle depletion, and no edema.  Labs reviewed: sodium 148 (H) Medications reviewed and include MVI and Vitamin D. Palliative Care Team is following.   Diet Order:  Diet regular Room service appropriate? Yes; Fluid consistency: Thin  Skin:  Wound (see comment) (stage II to L buttocks)  Last BM:  6/29  Height:   Ht Readings from Last 1 Encounters:  11/09/16 5\' 3"  (1.6 m)    Weight:   Wt Readings from Last 1 Encounters:  11/09/16 126 lb 14.4 oz (57.6 kg)    Ideal Body Weight:  52.3 kg  BMI:  Body mass index is 22.48 kg/m.  Estimated Nutritional Needs:   Kcal:  1400-1600  Protein:  70-80 gm  Fluid:  1.5 L  EDUCATION NEEDS:   No education needs identified at this time  Joaquin CourtsKimberly Harris, RD, LDN, CNSC Pager [phone] After Hours Pager  [phone]

## 2016-11-11 DIAGNOSIS — R638 Other symptoms and signs concerning food and fluid intake: Secondary | ICD-10-CM

## 2016-11-11 LAB — GLUCOSE, CAPILLARY: Glucose-Capillary: 126 mg/dL — ABNORMAL HIGH (ref 65–99)

## 2016-11-11 LAB — BASIC METABOLIC PANEL
ANION GAP: 6 (ref 5–15)
BUN: 23 mg/dL — ABNORMAL HIGH (ref 6–20)
CALCIUM: 8.9 mg/dL (ref 8.9–10.3)
CO2: 25 mmol/L (ref 22–32)
Chloride: 117 mmol/L — ABNORMAL HIGH (ref 101–111)
Creatinine, Ser: 0.93 mg/dL (ref 0.44–1.00)
Glucose, Bld: 146 mg/dL — ABNORMAL HIGH (ref 65–99)
POTASSIUM: 3.9 mmol/L (ref 3.5–5.1)
Sodium: 148 mmol/L — ABNORMAL HIGH (ref 135–145)

## 2016-11-11 MED ORDER — BISACODYL 5 MG PO TBEC
5.0000 mg | DELAYED_RELEASE_TABLET | Freq: Once | ORAL | Status: AC
  Administered 2016-11-11: 5 mg via ORAL
  Filled 2016-11-11: qty 1

## 2016-11-11 MED ORDER — CLOPIDOGREL BISULFATE 75 MG PO TABS
75.0000 mg | ORAL_TABLET | Freq: Every day | ORAL | Status: DC
  Administered 2016-11-12 – 2016-11-13 (×2): 75 mg via ORAL
  Filled 2016-11-11 (×2): qty 1

## 2016-11-11 NOTE — Progress Notes (Signed)
PROGRESS NOTE                                                                                                                                                                                                             Patient Demographics:    Cheyenne Marsh, is a 66 y.o. female, DOB - October 13, 1950, ZOX:[id number]  Admit date - 11/08/2016   Admitting Physician Jackie Plum, MD  Outpatient Primary MD for the patient is Patient, No Pcp Per  LOS - 3  Outpatient Specialists: none  Chief Complaint  Patient presents with  . Weakness       Brief Narrative   66 year old female with medical history of cerebral palsy and CVA. She has had progressive symptoms of her cerebral palsy and is being cared by her sister 24/7. Patient brought to the ED for progressive generalized weakness. Also has poor oral intake for past several weeks. Patient has shown a decline in her function since 2014 and has been fully dependent of her ADLs. Recently she also has been having multiple falls (mainly when she is sitting on the edge of the bed or on a commode).  In the ED she was tachycardic, hypotensive and clinically appeared dehydrated. Head and cervical spine showed significant narrowing at the level of C2. She was also found to be septic with fever of 100.90 Fahrenheit, WBC 14.5 K, lactic acid 2.04. UA was positive for UTI. Chemistry showed sodium of 156, chloride 122,, BUN 48 and creatinine 2.38. Admitted to stepdown unit .    Subjective:    nonverbal and noncommunicative, Blood pressure remains stable on lower side.   Assessment  & Plan :    Principal Problem:   Sepsis (HCC) Possibly due to UTI and severe dehydration.  Empiric Rocephin. Urine and blood cultures Negative for growth. Blood pressure improved with fluids and  IV hydrocortisone.    Active Problems: Cervical spinal stenosis Severe with multifactorial stenosis at the level of  C1-C2  level with spinal cord compression and C1 myelomalacia Also shows significant spinal cord myelomalacia at C6-C7. Has chronic platyspondyly.   Neurosurgery consult appreciated. Not a candidate for surgery and would not improve her quality of life/ADLs. Palliative care consulted with plan on home with hospice.  Acute kidney injury Secondary to sepsis and severe dehydration. Improved with IV fluids.  Severe hypernatremia Secondary  to dehydration. Slowly improving with D5. Continue for now.  Anemia Drop in hemoglobin from admission (i suspect hb on admission was higher due to dehydration). Monitor for now.  Hypotension Possibly due to sepsis. Improved with stress dose steroids. Will discontinue tomorrow if blood pressure remains stable.    Cerebral palsy (HCC)    Pressure injury of skin, sacral ulcer ( unstagable) Care per nursing.  Hx of CVA Resume Plavix. continue statin.    Code Status : DO NOT RESUSCITATE  Family Communication  : None at Bedside  Disposition Plan  : Home with hospice possibly in the next 24-48 hours if improving.  Barriers For Discharge : active symptoms  Consults  :   Palliative care  Procedures  :  CT/ MRI  Cervical spine   DVT Prophylaxis  :  Lovenox -   Lab Results  Component Value Date   PLT 143 (L) 11/09/2016    Antibiotics  :    Anti-infectives    Start     Dose/Rate Route Frequency Ordered Stop   11/09/16 1400  cefTRIAXone (ROCEPHIN) 1 g in dextrose 5 % 50 mL IVPB     1 g 100 mL/hr over 30 Minutes Intravenous Every 24 hours 11/08/16 1413     11/08/16 1415  cefTRIAXone (ROCEPHIN) 1 g in dextrose 5 % 50 mL IVPB     1 g 100 mL/hr over 30 Minutes Intravenous  Once 11/08/16 1404 11/08/16 1513        Objective:   Vitals:   11/10/16 2100 11/11/16 0117 11/11/16 0510 11/11/16 1000  BP: 106/61 101/63 108/61 100/69  Pulse: 86 (!) 59 61 77  Resp: 18 20 18    Temp: 98.3 F (36.8 C) 98.5 F (36.9 C) 98.1 F (36.7 C) (!) 97 F  (36.1 C)  TempSrc: Oral Oral Oral Oral  SpO2: 100% 100% 100% 93%  Weight:      Height:        Wt Readings from Last 3 Encounters:  11/09/16 57.6 kg (126 lb 14.4 oz)  12/06/15 59 kg (130 lb)     Intake/Output Summary (Last 24 hours) at 11/11/16 1328 Last data filed at 11/10/16 2126  Gross per 24 hour  Intake                3 ml  Output                0 ml  Net                3 ml     Physical Exam Gen.: Elderly female nonverbal, fatigued HEENT: Moist mucosa, supple neck Chest: Clear bilaterally CVS: Normal S1 and S2, no murmurs GI: Soft, non-distended, nontender Musculoskeletal: Limited mobility of right extremity CNS: Awake and alert, responds to few sign language commands    Data Review:    CBC  Recent Labs Lab 11/08/16 1302 11/09/16 0329  WBC 17.6* 14.5*  HGB 10.8* 7.4*  HCT 35.4* 25.2*  PLT 203 143*  MCV 96.7 97.3  MCH 29.5 28.6  MCHC 30.5 29.4*  RDW 13.3 13.4  LYMPHSABS 1.7 2.7  MONOABS 0.8 1.2*  EOSABS 0.0 0.0  BASOSABS 0.0 0.0    Chemistries   Recent Labs Lab 11/08/16 1302 11/09/16 0329 11/09/16 1738 11/10/16 0205 11/11/16 0401  NA 156* 154* 152* 148* 148*  K 5.0 4.0 4.0 4.1 3.9  CL 122* 125* 122* 120* 117*  CO2 23 25 25 23 25   GLUCOSE 152* 155* 131* 155*  146*  BUN 48* 36* 29* 27* 23*  CREATININE 2.38* 1.35* 1.11* 0.96 0.93  CALCIUM 9.5 8.2* 8.7* 8.7* 8.9  AST 24 20  --   --   --   ALT 19 14  --   --   --   ALKPHOS 65 54  --   --   --   BILITOT 0.4 0.4  --   --   --    ------------------------------------------------------------------------------------------------------------------ No results for input(s): CHOL, HDL, LDLCALC, TRIG, CHOLHDL, LDLDIRECT in the last 72 hours.  No results found for: HGBA1C ------------------------------------------------------------------------------------------------------------------  Recent Labs  11/09/16 0329  TSH 1.543    ------------------------------------------------------------------------------------------------------------------ No results for input(s): VITAMINB12, FOLATE, FERRITIN, TIBC, IRON, RETICCTPCT in the last 72 hours.  Coagulation profile  Recent Labs Lab 11/09/16 0329  INR 1.38    No results for input(s): DDIMER in the last 72 hours.  Cardiac Enzymes No results for input(s): CKMB, TROPONINI, MYOGLOBIN in the last 168 hours.  Invalid input(s): CK ------------------------------------------------------------------------------------------------------------------ No results found for: BNP  Inpatient Medications  Scheduled Meds: . cholecalciferol  1,000 Units Oral Daily  . donepezil  5 mg Oral QHS  . feeding supplement (ENSURE ENLIVE)  237 mL Oral BID BM  . hydrocortisone sod succinate (SOLU-CORTEF) inj  100 mg Intravenous Q8H  . multivitamin with minerals  1 tablet Oral Daily  . sodium chloride flush  3 mL Intravenous Q12H  . sodium chloride flush  3 mL Intravenous Q12H  . thioridazine  50 mg Oral BID   Continuous Infusions: . sodium chloride    . cefTRIAXone (ROCEPHIN)  IV 1 g (11/10/16 1412)  . dextrose 100 mL/hr at 11/09/16 2344   PRN Meds:.sodium chloride, acetaminophen **OR** acetaminophen, bisacodyl, ondansetron **OR** ondansetron (ZOFRAN) IV, polyethylene glycol, sodium chloride flush  Micro Results Recent Results (from the past 240 hour(s))  Urine culture     Status: None   Collection Time: 11/08/16 12:52 PM  Result Value Ref Range Status   Specimen Description URINE, CATHETERIZED  Final   Special Requests NONE  Final   Culture   Final    NO GROWTH Performed at [facility], [address], [place], [place] [address]    Report Status 11/09/2016 FINAL  Final  Blood Culture (routine x 2)     Status: None (Preliminary result)   Collection Time: 11/08/16 12:56 PM  Result Value Ref Range Status   Specimen Description BLOOD RIGHT HAND  Final   Special  Requests IN PEDIATRIC BOTTLE Blood Culture adequate volume  Final   Culture   Final    NO GROWTH 2 DAYS Performed at [facility], [address], [place], [place] [address]    Report Status PENDING  Incomplete  Blood Culture (routine x 2)     Status: None (Preliminary result)   Collection Time: 11/08/16  1:03 PM  Result Value Ref Range Status   Specimen Description BLOOD LEFT ANTECUBITAL  Final   Special Requests   Final    BOTTLES DRAWN AEROBIC AND ANAEROBIC Blood Culture adequate volume   Culture   Final    NO GROWTH 2 DAYS Performed at [facility], [address] [place], [place] [address]    Report Status PENDING  Incomplete  MRSA PCR Screening     Status: None   Collection Time: 11/09/16 12:34 AM  Result Value Ref Range Status   MRSA by PCR NEGATIVE NEGATIVE Final    Comment:  The GeneXpert MRSA Assay (FDA approved for NASAL specimens only), is one component of a comprehensive MRSA colonization surveillance program. It is not intended to diagnose MRSA infection nor to guide or monitor treatment for MRSA infections.     Radiology Reports Ct Head Wo Contrast  Result Date: 11/08/2016 CLINICAL DATA:  Multiple recent falls.  Decreased mental status. EXAM: CT HEAD WITHOUT CONTRAST CT MAXILLOFACIAL WITHOUT CONTRAST CT CERVICAL SPINE WITHOUT CONTRAST TECHNIQUE: Multidetector CT imaging of the head, cervical spine, and maxillofacial structures were performed using the standard protocol without intravenous contrast. Multiplanar CT image reconstructions of the cervical spine and maxillofacial structures were also generated. COMPARISON:  August 27, 2008 FINDINGS: CT HEAD FINDINGS Brain: No subdural, epidural, or subarachnoid hemorrhage. Cerebellum, brainstem, and basal cisterns are normal. Encephalomalacia is seen in the right frontal lobe, unchanged since 2010, consistent with remote infarct. No acute cortical ischemia or infarct is identified. The ventricles and  sulci are unchanged within visualize limits. No mass effect or midline shift. Vascular: No hyperdense vessel or unexpected calcification. Skull: Normal. Negative for fracture or focal lesion. Other: None. CT MAXILLOFACIAL FINDINGS Osseous: No fracture or mandibular dislocation. No destructive process. Orbits: Negative. No traumatic or inflammatory finding. Sinuses: Clear. Soft tissues: Negative. CT CERVICAL SPINE FINDINGS Alignment: The pre odontoid space is more narrow in the interval, due to increasing hypertrophy of the anterior arch of C1. There is straightening of normal lordosis which is similar in the interval. The right lateral mass of C1 appears to have slipped more anteriorly versus C2 in the interval. There was some anterolisthesis in this region previously but the listhesis measures 7 mm on series 19, image 19 today versus 3.7 mm previously. There is no significant anterolisthesis on the left at this level. No fractures are seen. Skull base and vertebrae: No fractures are identified. Soft tissues and spinal canal: No prevertebral soft tissue swelling identified. There is increased attenuation in the fat at the base of the neck on the right such as on series 17, image 17. 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 Increased canal narrowing is seen at the level of the odontoid process due to increased anterolisthesis of C1 versus C2 on the right. The canal measures 6.8 mm at this level. The diameter of the canal is otherwise similar in the interval. Disc levels: Severe multilevel degenerative changes with anterior osteophytes. Upper chest: Negative. Other: No other abnormalities. IMPRESSION: 1. No fracture identified in the cervical spine. However, C1 appears to be anterolisthesed versus C2, particularly on the right. While this was present previously, it has increased in the interval and canal narrowing at the level of the odontoid process has significantly worsened in the interval. The canal measures  6.8 mm at this level today. An MRI could better assess the cord at this level. Pressure on the cord at this level could result in the reported symptoms of decreased ability to perform ADL's. I suspect this is most likely due to degenerative change or ligamentous laxity rather than traumatic change. 2. Chronic right frontal infarct. No acute intracranial abnormality. 3. No facial bone fracture identified. 4. Increased attenuation in the fat at the base of neck on the right is likely posttraumatic given history. No large hematoma seen in this region. No fractures seen in the adjacent bones. CT imaging in this region could further assess if clinically warranted. Findings called to Dr. Rhunette Croft. Electronically Signed   By: Gerome Sam III M.D   On: 11/08/2016 14:57   Ct Cervical Spine  Wo Contrast  Result Date: 11/08/2016 CLINICAL DATA:  Multiple recent falls.  Decreased mental status. EXAM: CT HEAD WITHOUT CONTRAST CT MAXILLOFACIAL WITHOUT CONTRAST CT CERVICAL SPINE WITHOUT CONTRAST TECHNIQUE: Multidetector CT imaging of the head, cervical spine, and maxillofacial structures were performed using the standard protocol without intravenous contrast. Multiplanar CT image reconstructions of the cervical spine and maxillofacial structures were also generated. COMPARISON:  August 27, 2008 FINDINGS: CT HEAD FINDINGS Brain: No subdural, epidural, or subarachnoid hemorrhage. Cerebellum, brainstem, and basal cisterns are normal. Encephalomalacia is seen in the right frontal lobe, unchanged since 2010, consistent with remote infarct. No acute cortical ischemia or infarct is identified. The ventricles and sulci are unchanged within visualize limits. No mass effect or midline shift. Vascular: No hyperdense vessel or unexpected calcification. Skull: Normal. Negative for fracture or focal lesion. Other: None. CT MAXILLOFACIAL FINDINGS Osseous: No fracture or mandibular dislocation. No destructive process. Orbits: Negative. No  traumatic or inflammatory finding. Sinuses: Clear. Soft tissues: Negative. CT CERVICAL SPINE FINDINGS Alignment: The pre odontoid space is more narrow in the interval, due to increasing hypertrophy of the anterior arch of C1. There is straightening of normal lordosis which is similar in the interval. The right lateral mass of C1 appears to have slipped more anteriorly versus C2 in the interval. There was some anterolisthesis in this region previously but the listhesis measures 7 mm on series 19, image 19 today versus 3.7 mm previously. There is no significant anterolisthesis on the left at this level. No fractures are seen. Skull base and vertebrae: No fractures are identified. Soft tissues and spinal canal: No prevertebral soft tissue swelling identified. There is increased attenuation in the fat at the base of the neck on the right such as on series 17, image 17. 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 Increased canal narrowing is seen at the level of the odontoid process due to increased anterolisthesis of C1 versus C2 on the right. The canal measures 6.8 mm at this level. The diameter of the canal is otherwise similar in the interval. Disc levels: Severe multilevel degenerative changes with anterior osteophytes. Upper chest: Negative. Other: No other abnormalities. IMPRESSION: 1. No fracture identified in the cervical spine. However, C1 appears to be anterolisthesed versus C2, particularly on the right. While this was present previously, it has increased in the interval and canal narrowing at the level of the odontoid process has significantly worsened in the interval. The canal measures 6.8 mm at this level today. An MRI could better assess the cord at this level. Pressure on the cord at this level could result in the reported symptoms of decreased ability to perform ADL's. I suspect this is most likely due to degenerative change or ligamentous laxity rather than traumatic change. 2. Chronic right  frontal infarct. No acute intracranial abnormality. 3. No facial bone fracture identified. 4. Increased attenuation in the fat at the base of neck on the right is likely posttraumatic given history. No large hematoma seen in this region. No fractures seen in the adjacent bones. CT imaging in this region could further assess if clinically warranted. Findings called to Dr. Rhunette Croft. Electronically Signed   By: Gerome Sam III M.D   On: 11/08/2016 14:57   Mr Cervical Spine Wo Contrast  Addendum Date: 11/09/2016   ADDENDUM REPORT: 11/09/2016 14:25 ADDENDUM: Study discussed by telephone with Dr. Gonzella Lex on 11/09/2016 at 1416 hours. Electronically Signed   By: Odessa Fleming M.D.   On: 11/09/2016 14:25   Result Date: 11/09/2016 CLINICAL  DATA:  66 year old female with cerebral palsy. Progressive worsening generalized weakness. Abnormal upper cervical spine on recent CT. EXAM: MRI CERVICAL SPINE WITHOUT CONTRAST TECHNIQUE: Multiplanar, multisequence MR imaging of the cervical spine was performed. No intravenous contrast was administered. COMPARISON:  CT head face and cervical spine 11/08/2016. FINDINGS: Study is moderately degraded by motion artifact despite repeated imaging attempts. Alignment: Stable from the recent CT. Straightening of cervical lordosis. Anterior positioning of C1 relative to C2, which appears to be multifactorial (see the next section). The atlanto dens interval appears relatively maintained as seen by CT. No other spondylolisthesis. Cervicothoracic junction alignment is within normal limits. Vertebrae: Platyspondyly. Superimposed very bulky anterior endplate osteophytes throughout the cervical spine. Superimposed bulky bony hypertrophy of the odontoid. No marrow edema or acute osseous abnormality identified. Visualized bone marrow signal is within normal limits. Cord: Abnormal spinal cord signal at the cervicomedullary junction/C1 (series 13, image 8). Associated spinal cord mass effect from compressive  myelopathy. Between the C2 and C6 levels spinal cord volume, signal, and morphology appears maintained. Abnormal T2 and STIR cord signal then at the C6-C7 level (series 13, image 9 and series 18, image 25. From the inferior C7 level into the upper thoracic spine cord signal and morphology appears maintained. Posterior Fossa, vertebral arteries, paraspinal tissues: Negative visualized brain parenchyma. Preserved carotid artery flow voids in the neck. No signal abnormality in the cervical spine ligamentous complex. No acute neck soft tissue findings are evident. Disc levels: Axial images are significantly motion degraded such that cervical degeneration was much better depicted on the recent CT. There is moderate to severe multifactorial spinal stenosis at the C1-odontoid level, related to the vertebral body morphology, subluxation, odontoid hypertrophy, and posterior ligamentous hypertrophy. Mild cervical spinal stenosis also at C2-C3, with up to severe degenerative foraminal stenosis worse on the right. No associated spinal cord mass effect. No other significant cervical spinal stenosis. IMPRESSION: 1. 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 2. Multifactorial moderate to severe spinal stenosis at the C1-odontoid level with spinal cord compression and associated C1 level myelomalacia. 3. 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 Electronically Signed: By: Odessa Fleming M.D. On: 11/09/2016 14:00   Dg Chest Port 1 View  Result Date: 11/08/2016 CLINICAL DATA:  History of cerebral palsy.  Nonverbal patient. EXAM: PORTABLE CHEST 1 VIEW COMPARISON:  06/17/2013 FINDINGS: Cardiomediastinal silhouette is normal. Mediastinal contours appear intact. There is no evidence of focal airspace consolidation, pleural effusion or pneumothorax. Bronchiectasis with mild peribronchial thickening in the right upper lobe. Osseous structures are  without acute abnormality. Soft tissues are grossly normal. IMPRESSION: Bronchiectasis with mild peribronchial thickening in the right upper lobe. Electronically Signed   By: Ted Mcalpine M.D.   On: 11/08/2016 13:34   Ct Maxillofacial Wo Contrast  Result Date: 11/08/2016 CLINICAL DATA:  Multiple recent falls.  Decreased mental status. EXAM: CT HEAD WITHOUT CONTRAST CT MAXILLOFACIAL WITHOUT CONTRAST CT CERVICAL SPINE WITHOUT CONTRAST TECHNIQUE: Multidetector CT imaging of the head, cervical spine, and maxillofacial structures were performed using the standard protocol without intravenous contrast. Multiplanar CT image reconstructions of the cervical spine and maxillofacial structures were also generated. COMPARISON:  August 27, 2008 FINDINGS: CT HEAD FINDINGS Brain: No subdural, epidural, or subarachnoid hemorrhage. Cerebellum, brainstem, and basal cisterns are normal. Encephalomalacia is seen in the right frontal lobe, unchanged since 2010, consistent with remote infarct. No acute cortical ischemia or infarct is identified. The ventricles and sulci are unchanged within visualize  limits. No mass effect or midline shift. Vascular: No hyperdense vessel or unexpected calcification. Skull: Normal. Negative for fracture or focal lesion. Other: None. CT MAXILLOFACIAL FINDINGS Osseous: No fracture or mandibular dislocation. No destructive process. Orbits: Negative. No traumatic or inflammatory finding. Sinuses: Clear. Soft tissues: Negative. CT CERVICAL SPINE FINDINGS Alignment: The pre odontoid space is more narrow in the interval, due to increasing hypertrophy of the anterior arch of C1. There is straightening of normal lordosis which is similar in the interval. The right lateral mass of C1 appears to have slipped more anteriorly versus C2 in the interval. There was some anterolisthesis in this region previously but the listhesis measures 7 mm on series 19, image 19 today versus 3.7 mm previously. There is no  significant anterolisthesis on the left at this level. No fractures are seen. Skull base and vertebrae: No fractures are identified. Soft tissues and spinal canal: No prevertebral soft tissue swelling identified. There is increased attenuation in the fat at the base of the neck on the right such as on series 17, image 17. 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 Increased canal narrowing is seen at the level of the odontoid process due to increased anterolisthesis of C1 versus C2 on the right. The canal measures 6.8 mm at this level. The diameter of the canal is otherwise similar in the interval. Disc levels: Severe multilevel degenerative changes with anterior osteophytes. Upper chest: Negative. Other: No other abnormalities. IMPRESSION: 1. No fracture identified in the cervical spine. However, C1 appears to be anterolisthesed versus C2, particularly on the right. While this was present previously, it has increased in the interval and canal narrowing at the level of the odontoid process has significantly worsened in the interval. The canal measures 6.8 mm at this level today. An MRI could better assess the cord at this level. Pressure on the cord at this level could result in the reported symptoms of decreased ability to perform ADL's. I suspect this is most likely due to degenerative change or ligamentous laxity rather than traumatic change. 2. Chronic right frontal infarct. No acute intracranial abnormality. 3. No facial bone fracture identified. 4. Increased attenuation in the fat at the base of neck on the right is likely posttraumatic given history. No large hematoma seen in this region. No fractures seen in the adjacent bones. CT imaging in this region could further assess if clinically warranted. Findings called to Dr. Rhunette Croft. Electronically Signed   By: Gerome Sam III M.D   On: 11/08/2016 14:57    Time Spent in minutes  25   Eddie North M.D on 11/11/2016 at 1:28 PM  Between 7am to  7pm - Pager - [phone]  After 7pm go to [email or web address] - password [facility]  [facility] -  Office  [phone]

## 2016-11-11 NOTE — Care Management Important Message (Signed)
Important Message  Patient Details  Name: Cheyenne MoseMiriam Marsh MRN: [id number] Date of Birth: 11/03/1950   Medicare Important Message Given:  Yes    [name] [name] 11/11/2016, 12:55 PM

## 2016-11-11 NOTE — Progress Notes (Signed)
Daily Progress Note   Patient Name: Cheyenne Marsh       Date: 11/11/2016 DOB: 14-Oct-1950  Age: 66 y.o. MRN#: [id number] Attending Physician: [name], MD Primary Care Physician: Patient, No Pcp Per Admit Date: 11/08/2016  Reason for Consultation/Follow-up: Establishing goals of care  Subjective: Cheyenne Marsh is doing much better today. Seems more at ease and smiling more.   Length of Stay: 3  Current Medications: Scheduled Meds:  . bisacodyl  5 mg Oral Once  . cholecalciferol  1,000 Units Oral Daily  . clopidogrel  75 mg Oral Q breakfast  . donepezil  5 mg Oral QHS  . feeding supplement (ENSURE ENLIVE)  237 mL Oral BID BM  . hydrocortisone sod succinate (SOLU-CORTEF) inj  100 mg Intravenous Q8H  . multivitamin with minerals  1 tablet Oral Daily  . sodium chloride flush  3 mL Intravenous Q12H  . sodium chloride flush  3 mL Intravenous Q12H  . thioridazine  50 mg Oral BID    Continuous Infusions: . sodium chloride    . cefTRIAXone (ROCEPHIN)  IV Stopped (11/11/16 1826)  . dextrose 100 mL/hr at 11/09/16 2344    PRN Meds: sodium chloride, acetaminophen **OR** acetaminophen, bisacodyl, ondansetron **OR** ondansetron (ZOFRAN) IV, polyethylene glycol, sodium chloride flush  Physical Exam        Constitutional: She appears well-developed.  Head: Normocephalic and atraumatic.  Cardiovascular: Normal rate and regular rhythm.   Pulmonary/Chest: Effort normal. No accessory muscle usage. No tachypnea. No respiratory distress.  Abdominal: Soft. Normal appearance.  Neurological: She is alert.  Difficult to tell orientation  Nursing note and vitals reviewed.  Vital Signs: BP 107/64 (BP Location: Left Arm)   Pulse 86   Temp 97.6 F (36.4 C) (Oral)   Resp 20   Ht _0  (1.6 m)   Wt  57.6 kg (126 lb 14.4 oz)   SpO2 100%   BMI 22.48 kg/m  SpO2: SpO2: 100 % O2 Device: O2 Device: Not Delivered O2 Flow Rate:    Intake/output summary:  Intake/Output Summary (Last 24 hours) at 11/11/16 1853 Last data filed at 11/10/16 2126  Gross per 24 hour  Intake                3 ml  Output  0 ml  Net                3 ml   LBM: Last BM Date: 11/07/16 Baseline Weight: Weight: 59 kg (130 lb) Most recent weight: Weight: 57.6 kg (126 lb 14.4 oz)       Palliative Assessment/Data:    Flowsheet Rows     Most Recent Value  Intake Tab  Referral Department  Hospitalist  Unit at Time of Referral  ICU  Palliative Care Primary Diagnosis  Neurology  Date Notified  11/08/16  Palliative Care Type  New Palliative care  Reason for referral  Clarify Goals of Care  Date of Admission  11/08/16  Date first seen by Palliative Care  11/09/16  # of days Palliative referral response time  1 Day(s)  # of days IP prior to Palliative referral  0  Clinical Assessment  Psychosocial & Spiritual Assessment  Palliative Care Outcomes      Patient Active Problem List   Diagnosis Date Noted  . 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 Goals of care, counseling/discussion   . Palliative care encounter   . Pressure injury of skin 11/09/2016  . Sepsis ([place]) 11/08/2016  . Abnormal CT of spine 11/08/2016  . Acute renal failure (ARF) ([place]) 11/08/2016  . Dehydration 11/08/2016  . Cerebral palsy ([place]) 11/08/2016  . Muscle weakness of extremity 11/08/2016    Palliative Care Assessment & Plan   HPI: 67 yo female with past medical history of cerebral palsy and CVA admitted on 11/08/2016 from home where her sister cares for her with generalized weakness with recent falls, decreased responsiveness, and decreased oral intake. Family has noted progressive decline over past 3 years. 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 Neurosurgery to see  but surgery thought to be risky.   Assessment: I met again with sister, Cheyenne Marsh, at bedside. Cheyenne Marsh ate better today per sister but still not taking in many fluids. We explored different ways to get Cheyenne Marsh to accept fluids. She likes sweets so we discussed making gatorade into popsicles to try and prevent recurrent dehydration. She is also constipated  And hasn't had a bowel movement since last Wednesday (but she usually only has 1-2 BM per week). Will give dulcolax pill.   We again discussed care for home. Cheyenne Marsh is focused on resources from [place] and getting equipment in the home to help with care. We discussed more having hospice services but unclear if this is still their desire.   I attempted to call niece/guardian Cheyenne Marsh but I realized I did not have her phone number - I called Cheyenne Marsh to obtain phone number but did not get her. Will try again tomorrow.   Recommendations/Plan:  Monitor closely for discomfort. Difficult to discern given communication barriers. She is able to read and used to love to write.   Provide nonverbal reassurance and encouraged family to bring items of comfort and familiarity from home.    Goals of Care and Additional Recommendations:  Limitations on Scope of Treatment: No Surgical Procedures and No Tracheostomy  Code Status:  DNR  Prognosis:    < 6 months likely with progressive cord compression and functioning status.    Discharge Planning:  Return home with sister. Recommend family to consider hospice support.    Thank you for allowing the Palliative Medicine Team to assist in the care of this patient.   Total Time 13mn Prolonged Time Billed  no  Greater than 50%  of this time was spent counseling and coordinating care related to the above assessment and plan.  Vinie Sill, NP Palliative Medicine Team Pager # [phone] (M-F 8a-5p) Team Phone # [phone] (Nights/Weekends)

## 2016-11-12 DIAGNOSIS — R571 Hypovolemic shock: Secondary | ICD-10-CM

## 2016-11-12 DIAGNOSIS — A419 Sepsis, unspecified organism: Principal | ICD-10-CM

## 2016-11-12 DIAGNOSIS — N179 Acute kidney failure, unspecified: Secondary | ICD-10-CM

## 2016-11-12 DIAGNOSIS — G9520 Unspecified cord compression: Secondary | ICD-10-CM

## 2016-11-12 DIAGNOSIS — E860 Dehydration: Secondary | ICD-10-CM

## 2016-11-12 DIAGNOSIS — R937 Abnormal findings on diagnostic imaging of other parts of musculoskeletal system: Secondary | ICD-10-CM

## 2016-11-12 DIAGNOSIS — G808 Other cerebral palsy: Secondary | ICD-10-CM

## 2016-11-12 LAB — GLUCOSE, CAPILLARY: Glucose-Capillary: 141 mg/dL — ABNORMAL HIGH (ref 65–99)

## 2016-11-12 LAB — BASIC METABOLIC PANEL
ANION GAP: 5 (ref 5–15)
BUN: 17 mg/dL (ref 6–20)
CHLORIDE: 113 mmol/L — AB (ref 101–111)
CO2: 24 mmol/L (ref 22–32)
CREATININE: 0.88 mg/dL (ref 0.44–1.00)
Calcium: 8.5 mg/dL — ABNORMAL LOW (ref 8.9–10.3)
Glucose, Bld: 146 mg/dL — ABNORMAL HIGH (ref 65–99)
POTASSIUM: 3.5 mmol/L (ref 3.5–5.1)
Sodium: 142 mmol/L (ref 135–145)

## 2016-11-12 MED ORDER — BISACODYL 10 MG RE SUPP
10.0000 mg | Freq: Every day | RECTAL | Status: DC | PRN
  Filled 2016-11-12: qty 1

## 2016-11-12 MED ORDER — HYDROCORTISONE NA SUCCINATE PF 100 MG IJ SOLR
50.0000 mg | Freq: Three times a day (TID) | INTRAMUSCULAR | Status: DC
Start: 2016-11-12 — End: 2016-11-13
  Administered 2016-11-12 – 2016-11-13 (×3): 50 mg via INTRAVENOUS
  Filled 2016-11-12 (×3): qty 2

## 2016-11-12 MED ORDER — BISACODYL 10 MG RE SUPP
10.0000 mg | Freq: Once | RECTAL | Status: AC
  Administered 2016-11-12: 10 mg via RECTAL

## 2016-11-12 MED ORDER — SENNA 8.6 MG PO TABS
1.0000 | ORAL_TABLET | Freq: Two times a day (BID) | ORAL | Status: DC
  Administered 2016-11-12 – 2016-11-13 (×3): 8.6 mg via ORAL
  Filled 2016-11-12 (×3): qty 1

## 2016-11-12 MED ORDER — BISACODYL 5 MG PO TBEC: 5.0000 mg | DELAYED_RELEASE_TABLET | Freq: Every day | ORAL | Status: DC | PRN

## 2016-11-12 MED ORDER — MAGNESIUM CITRATE PO SOLN
1.0000 | Freq: Once | ORAL | Status: DC
  Filled 2016-11-12: qty 296

## 2016-11-12 NOTE — Progress Notes (Signed)
Daily Progress Note   Patient Name: Cheyenne Marsh       Date: 11/12/2016 DOB: 07-Apr-1951  Age: 66 y.o. MRN#: [id number] Attending Physician: Mendel Corning, MD Primary Care Physician: Patient, No Pcp Per Admit Date: 11/08/2016  Reason for Consultation/Follow-up: Establishing goals of care  Subjective: Cheyenne Marsh is doing much better today. Intake is much improved today.   Length of Stay: 4  Current Medications: Scheduled Meds:  . bisacodyl  10 mg Rectal Once  . cholecalciferol  1,000 Units Oral Daily  . clopidogrel  75 mg Oral Q breakfast  . donepezil  5 mg Oral QHS  . feeding supplement (ENSURE ENLIVE)  237 mL Oral BID BM  . hydrocortisone sod succinate (SOLU-CORTEF) inj  50 mg Intravenous Q8H  . multivitamin with minerals  1 tablet Oral Daily  . senna  1 tablet Oral BID  . sodium chloride flush  3 mL Intravenous Q12H  . sodium chloride flush  3 mL Intravenous Q12H  . thioridazine  50 mg Oral BID    Continuous Infusions: . sodium chloride    . cefTRIAXone (ROCEPHIN)  IV Stopped (11/11/16 1826)  . dextrose 100 mL/hr at 11/11/16 2117    PRN Meds: sodium chloride, acetaminophen **OR** acetaminophen, bisacodyl, bisacodyl, ondansetron **OR** ondansetron (ZOFRAN) IV, polyethylene glycol, sodium chloride flush  Physical Exam        Constitutional: She appears well-developed.  Head: Normocephalic and atraumatic.  Cardiovascular: Normal rate and regular rhythm.   Pulmonary/Chest: Effort normal. No accessory muscle usage. No tachypnea. No respiratory distress.  Abdominal: Soft. Normal appearance.  Neurological: She is alert.  Difficult to tell orientation  Nursing note and vitals reviewed.  Vital Signs: BP 122/72 (BP Location: Left Arm)   Pulse 79   Temp 98.4 F (36.9 C) (Oral)    Resp 20   Ht 5' 3" (1.6 m)   Wt 57.6 kg (126 lb 14.4 oz)   SpO2 99%   BMI 22.48 kg/m  SpO2: SpO2: 99 % O2 Device: O2 Device: Not Delivered O2 Flow Rate:    Intake/output summary:   Intake/Output Summary (Last 24 hours) at 11/12/16 1322 Last data filed at 11/12/16 0600  Gross per 24 hour  Intake              920 ml  Output  400 ml  Net              520 ml   LBM: Last BM Date: 11/07/16 Baseline Weight: Weight: 59 kg (130 lb) Most recent weight: Weight: 57.6 kg (126 lb 14.4 oz)       Palliative Assessment/Data: 30%    Flowsheet Rows     Most Recent Value  Intake Tab  Referral Department  Hospitalist  Unit at Time of Referral  ICU  Palliative Care Primary Diagnosis  Neurology  Date Notified  11/08/16  Palliative Care Type  New Palliative care  Reason for referral  Clarify Goals of Care  Date of Admission  11/08/16  Date first seen by Palliative Care  11/09/16  # of days Palliative referral response time  1 Day(s)  # of days IP prior to Palliative referral  0  Clinical Assessment  Psychosocial & Spiritual Assessment  Palliative Care Outcomes      Patient Active Problem List   Diagnosis Date Noted  . Poor fluid intake   . 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 Goals of care, counseling/discussion   . Palliative care encounter   . Pressure injury of skin 11/09/2016  . Sepsis ([place]) 11/08/2016  . Abnormal CT of spine 11/08/2016  . Acute renal failure (ARF) ([place]) 11/08/2016  . Dehydration 11/08/2016  . Cerebral palsy ([place]) 11/08/2016  . Muscle weakness of extremity 11/08/2016    Palliative Care Assessment & Plan   HPI: 66 yo female with past medical history of cerebral palsy and CVA admitted on 11/08/2016 from home where her sister cares for her with generalized weakness with recent falls, decreased responsiveness, and decreased oral intake. Family has noted progressive decline over past 3 years. 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 Neurosurgery to see but surgery thought to be risky.   Assessment: I met again with sister, Cheyenne Marsh, at bedside. Cheyenne Marsh seemed bothered by something today and we finally figured out that she was uncomfortable with stage II sacral ulcer and seemed better once she lied down and we turned her on her side.   Spoke more with Cheyenne Marsh today and provided support. After talking she does still believe that they would benefit from hospice support at home. I also spoke with niece/guardian/ Cheyenne Marsh who confirms that their goals have not changed and the goal is comfort/QOL, treat the treatable/reversible, they do not seem interested in pursuing artificial feeding if indicated (but may need further discussion).   Recommendations/Plan:  Monitor closely for discomfort. Difficult to discern given communication barriers. She is able to read and used to love to write.   Provide nonverbal reassurance and encouraged family to bring items of comfort and familiarity from home.   Once medically optimized return home with hospice services.    Goals of Care and Additional Recommendations:  Limitations on Scope of Treatment: No Surgical Procedures and No Tracheostomy  Code Status:  DNR  Prognosis:    < 6 months with progressive cord compression and functioning decline.    Discharge Planning:  Return home with sister and hospice support.    Thank you for allowing the Palliative Medicine Team to assist in the care of this patient.   Total Time 68mn Prolonged Time Billed  no       Greater than 50%  of this time was spent counseling and coordinating care related to the above assessment and plan.  AVinie Sill NP Palliative Medicine Team  Pager # [phone] (M-F 8a-5p) Team Phone # [phone] (Nights/Weekends)

## 2016-11-12 NOTE — Care Management Note (Signed)
Case Management Note  Patient Details  Name: Marzelle Rutten MRN: [id number] Date of Birth: [birth year]  Subjective/Objective:                    Action/Plan: CM consulted for home with hospice. CM met with the patients sister, Fraser Din, and spoke to [place] the legal guardian over speaker phone. CM answered questions and provided a list of hospice agencies. They selected [facility]. CM notified Harmon Pier with [facility].  CM continuing to follow.  Expected Discharge Date:  11/11/16               Expected Discharge Plan:  Home w Hospice Care  In-House Referral:     Discharge planning Services  CM Consult  Post Acute Care Choice:  Durable Medical Equipment, Hospice Choice offered to:  Sibling  DME Arranged:    DME Agency:     HH Arranged:    [place] Agency:     Status of Service:  In process, will continue to follow  If discussed at Long Length of Stay Meetings, dates discussed:    Additional Comments:  Pollie Friar, RN 11/12/2016, 12:51 PM

## 2016-11-12 NOTE — Progress Notes (Signed)
[facility]                                                                              Patient Demographics  Cheyenne Marsh, is a 66 y.o. female, DOB - 29-Mar-1951, ZOX:[id number]  Admit date - 11/08/2016   Admitting Physician Jackie Plum, MD  Outpatient Primary MD for the patient is Patient, No Pcp Per  Outpatient specialists:   LOS - 4  days   Medical records reviewed and are as summarized below:    Chief Complaint  Patient presents with  . 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 Patient was brought to ED for progressive generalized weakness, poor oral intake for past several weeks. Patient has shown a decline in her function since 2014 and has been fully dependent of her ADLs.  Recently she also has been having multiple falls when she is sitting on the edge of the bed or on a commode.  In the ED she was tachycardic, hypotensive and clinically appeared dehydrated. Head and cervical spine showed significant narrowing at the level of C2. She was also found to be septic with fever of 100.90 Fahrenheit, WBC 14.5 K, lactic acid 2.04. UA was positive for UTI. Chemistry showed sodium of 156, chloride 122,, BUN 48 and creatinine 2.38. She was admitted to the stepdown unit   Assessment & Plan    Principal Problem:   Sepsis (HCC) - Possibly due to UTI, severe dehydration - Sepsis physiology has resolved. - Patient was placed on empiric Rocephin - Urine and blood cultures have been negative. - Continue IV fluids however wean off hydrocortisone gently, decreased to 50 mg every 8 hours today  Active Problems:  Cervical spinal cord compression (HCC), cervical spinal stenosis - Severe with multifactorial stenosis at the level of C1 and C2 with spinal cord compression, C1 myelomalacia. 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 Bevely Palmer. - She was not considered a candidate for surgery and would not improve her quality of life/ADLs - Palliative care was consulted with plan on home with hospice at this time, equipment will be arranged by tomorrow    Acute renal failure (ARF) (HCC) - Likely due to UTI, sepsis and severe dehydration - Improved    Dehydration, severe hypernatremia - Improving, secondary to dehydration and poor oral intake - Continue D5 for now, sodium 142 today    Cerebral palsy (HCC) - At baseline    Pressure injury of skin - Sacral ulcer unstageable, wound care per nursing  History of CVA - Resumed Plavix, continue statin  Constipation - Continue Dulcolax suppository, difficult to give her pills or mag citrate, patient refuses   Code Status: DNR DVT Prophylaxis:  Lovenox  Family Communication: No family member at the bedside   Disposition Plan: Possibly home with hospice tomorrow  Time Spent in minutes  25 minutes  Procedures:    Consultants:   Neurosurgery  Antimicrobials:  Medications  Scheduled Meds: . bisacodyl  10 mg Rectal Once  . cholecalciferol  1,000 Units Oral Daily  . clopidogrel  75 mg Oral Q breakfast  . donepezil  5 mg Oral QHS  . feeding supplement (ENSURE ENLIVE)  237 mL Oral BID BM  . hydrocortisone sod succinate (SOLU-CORTEF) inj  50 mg Intravenous Q8H  . multivitamin with minerals  1 tablet Oral Daily  . senna  1 tablet Oral BID  . sodium chloride flush  3 mL Intravenous Q12H  . sodium chloride flush  3 mL Intravenous Q12H  . thioridazine  50 mg Oral BID   Continuous Infusions: . sodium chloride    . cefTRIAXone (ROCEPHIN)  IV Stopped (11/11/16 1826)  . dextrose 100 mL/hr at 11/11/16 2117   PRN Meds:.sodium chloride, acetaminophen **OR** acetaminophen, bisacodyl, bisacodyl, ondansetron **OR** ondansetron (ZOFRAN) IV, polyethylene  glycol, sodium chloride flush   Antibiotics   Anti-infectives    Start     Dose/Rate Route Frequency Ordered Stop   11/09/16 1400  cefTRIAXone (ROCEPHIN) 1 g in dextrose 5 % 50 mL IVPB     1 g 100 mL/hr over 30 Minutes Intravenous Every 24 hours 11/08/16 1413     11/08/16 1415  cefTRIAXone (ROCEPHIN) 1 g in dextrose 5 % 50 mL IVPB     1 g 100 mL/hr over 30 Minutes Intravenous  Once 11/08/16 1404 11/08/16 1513        Subjective:   Cheyenne Marsh was seen and examined today. Severe cerebral palsy, difficult to obtain review of systems from the patient. No acute issues overnight. No fevers or chills. + Constipation.   Objective:   Vitals:   11/11/16 2129 11/12/16 0010 11/12/16 0458 11/12/16 0952  BP: 112/61 103/64 110/72 122/72  Pulse: 89 88 77 79  Resp: 20 18 20 20   Temp: (!) 97.5 F (36.4 C) 97.6 F (36.4 C) 97.8 F (36.6 C) 98.4 F (36.9 C)  TempSrc: Oral Oral Oral Oral  SpO2: 100% 100% 100% 99%  Weight:      Height:        Intake/Output Summary (Last 24 hours) at 11/12/16 1116 Last data filed at 11/12/16 0600  Gross per 24 hour  Intake              920 ml  Output              400 ml  Net              520 ml     Wt Readings from Last 3 Encounters:  11/09/16 57.6 kg (126 lb 14.4 oz)  12/06/15 59 kg (130 lb)     Exam  General: Alert and Awake, cerebral palsy, nonverbal  Eyes: PERRLA, EOMI, Anicteric Sclera,  HEENT:  Atraumatic, normocephalic, normal oropharynx  Cardiovascular: S1 S2 auscultated, no rubs, murmurs or gallops. Regular rate and rhythm.  Respiratory: Clear to auscultation bilaterally, no wheezing, rales or rhonchi  Gastrointestinal: Soft, nontender, nondistended, + bowel sounds  Ext: no pedal edema bilaterally  Neuro: does not cooperate  Musculoskeletal: No digital cyanosis, clubbing limited mobility of right extremity  Skin: No rashes  Psych: pleasant and smiles   Data Reviewed:  I have personally reviewed following labs and  imaging studies  Micro Results Recent Results (from the past 240 hour(s))  Urine culture     Status: None   Collection Time: 11/08/16 12:52 PM  Result Value Ref Range Status   Specimen Description URINE, CATHETERIZED  Final  Special Requests NONE  Final   Culture   Final    NO GROWTH Performed at [facility], [address], [place], [place] [address]    Report Status 11/09/2016 FINAL  Final  Blood Culture (routine x 2)     Status: None (Preliminary result)   Collection Time: 11/08/16 12:56 PM  Result Value Ref Range Status   Specimen Description BLOOD RIGHT HAND  Final   Special Requests IN PEDIATRIC BOTTLE Blood Culture adequate volume  Final   Culture   Final    NO GROWTH 4 DAYS Performed at [facility], [address], [place], [place] [address]    Report Status PENDING  Incomplete  Blood Culture (routine x 2)     Status: None (Preliminary result)   Collection Time: 11/08/16  1:03 PM  Result Value Ref Range Status   Specimen Description BLOOD LEFT ANTECUBITAL  Final   Special Requests   Final    BOTTLES DRAWN AEROBIC AND ANAEROBIC Blood Culture adequate volume   Culture   Final    NO GROWTH 4 DAYS Performed at [facility], [address], [place], [place] [address]    Report Status PENDING  Incomplete  MRSA PCR Screening     Status: None   Collection Time: 11/09/16 12:34 AM  Result Value Ref Range Status   MRSA by PCR NEGATIVE NEGATIVE Final    Comment:        The GeneXpert MRSA Assay (FDA approved for NASAL specimens only), is one component of a comprehensive MRSA colonization surveillance program. It is not intended to diagnose MRSA infection nor to guide or monitor treatment for MRSA infections.     Radiology Reports Ct Head Wo Contrast  Result Date: 11/08/2016 CLINICAL DATA:  Multiple recent falls.  Decreased mental status. EXAM: CT HEAD WITHOUT CONTRAST CT MAXILLOFACIAL WITHOUT CONTRAST CT CERVICAL SPINE WITHOUT CONTRAST TECHNIQUE:  Multidetector CT imaging of the head, cervical spine, and maxillofacial structures were performed using the standard protocol without intravenous contrast. Multiplanar CT image reconstructions of the cervical spine and maxillofacial structures were also generated. COMPARISON:  August 27, 2008 FINDINGS: CT HEAD FINDINGS Brain: No subdural, epidural, or subarachnoid hemorrhage. Cerebellum, brainstem, and basal cisterns are normal. Encephalomalacia is seen in the right frontal lobe, unchanged since 2010, consistent with remote infarct. No acute cortical ischemia or infarct is identified. The ventricles and sulci are unchanged within visualize limits. No mass effect or midline shift. Vascular: No hyperdense vessel or unexpected calcification. Skull: Normal. Negative for fracture or focal lesion. Other: None. CT MAXILLOFACIAL FINDINGS Osseous: No fracture or mandibular dislocation. No destructive process. Orbits: Negative. No traumatic or inflammatory finding. Sinuses: Clear. Soft tissues: Negative. CT CERVICAL SPINE FINDINGS Alignment: The pre odontoid space is more narrow in the interval, due to increasing hypertrophy of the anterior arch of C1. There is straightening of normal lordosis which is similar in the interval. The right lateral mass of C1 appears to have slipped more anteriorly versus C2 in the interval. There was some anterolisthesis in this region previously but the listhesis measures 7 mm on series 19, image 19 today versus 3.7 mm previously. There is no significant anterolisthesis on the left at this level. No fractures are seen. Skull base and vertebrae: No fractures are identified. Soft tissues and spinal canal: No prevertebral soft tissue swelling identified. There is increased attenuation in the fat at the base of the neck on the right such as on series 17, image  17. 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 Increased canal narrowing is seen at the level of the odontoid process due to increased  anterolisthesis of C1 versus C2 on the right. The canal measures 6.8 mm at this level. The diameter of the canal is otherwise similar in the interval. Disc levels: Severe multilevel degenerative changes with anterior osteophytes. Upper chest: Negative. Other: No other abnormalities. IMPRESSION: 1. No fracture identified in the cervical spine. However, C1 appears to be anterolisthesed versus C2, particularly on the right. While this was present previously, it has increased in the interval and canal narrowing at the level of the odontoid process has significantly worsened in the interval. The canal measures 6.8 mm at this level today. An MRI could better assess the cord at this level. Pressure on the cord at this level could result in the reported symptoms of decreased ability to perform ADL's. I suspect this is most likely due to degenerative change or ligamentous laxity rather than traumatic change. 2. Chronic right frontal infarct. No acute intracranial abnormality. 3. No facial bone fracture identified. 4. Increased attenuation in the fat at the base of neck on the right is likely posttraumatic given history. No large hematoma seen in this region. No fractures seen in the adjacent bones. CT imaging in this region could further assess if clinically warranted. Findings called to Dr. Rhunette Croft. Electronically Signed   By: Gerome Sam III M.D   On: 11/08/2016 14:57   Ct Cervical Spine Wo Contrast  Result Date: 11/08/2016 CLINICAL DATA:  Multiple recent falls.  Decreased mental status. EXAM: CT HEAD WITHOUT CONTRAST CT MAXILLOFACIAL WITHOUT CONTRAST CT CERVICAL SPINE WITHOUT CONTRAST TECHNIQUE: Multidetector CT imaging of the head, cervical spine, and maxillofacial structures were performed using the standard protocol without intravenous contrast. Multiplanar CT image reconstructions of the cervical spine and maxillofacial structures were also generated. COMPARISON:  August 27, 2008 FINDINGS: CT HEAD FINDINGS  Brain: No subdural, epidural, or subarachnoid hemorrhage. Cerebellum, brainstem, and basal cisterns are normal. Encephalomalacia is seen in the right frontal lobe, unchanged since 2010, consistent with remote infarct. No acute cortical ischemia or infarct is identified. The ventricles and sulci are unchanged within visualize limits. No mass effect or midline shift. Vascular: No hyperdense vessel or unexpected calcification. Skull: Normal. Negative for fracture or focal lesion. Other: None. CT MAXILLOFACIAL FINDINGS Osseous: No fracture or mandibular dislocation. No destructive process. Orbits: Negative. No traumatic or inflammatory finding. Sinuses: Clear. Soft tissues: Negative. CT CERVICAL SPINE FINDINGS Alignment: The pre odontoid space is more narrow in the interval, due to increasing hypertrophy of the anterior arch of C1. There is straightening of normal lordosis which is similar in the interval. The right lateral mass of C1 appears to have slipped more anteriorly versus C2 in the interval. There was some anterolisthesis in this region previously but the listhesis measures 7 mm on series 19, image 19 today versus 3.7 mm previously. There is no significant anterolisthesis on the left at this level. No fractures are seen. Skull base and vertebrae: No fractures are identified. Soft tissues and spinal canal: No prevertebral soft tissue swelling identified. There is increased attenuation in the fat at the base of the neck on the right such as on series 17, image 17. 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 Increased canal narrowing is seen at the level of the odontoid process due to increased anterolisthesis of C1 versus C2 on the right. The canal measures 6.8 mm at this level. The diameter of the canal is otherwise  similar in the interval. Disc levels: Severe multilevel degenerative changes with anterior osteophytes. Upper chest: Negative. Other: No other abnormalities. IMPRESSION: 1. No fracture identified  in the cervical spine. However, C1 appears to be anterolisthesed versus C2, particularly on the right. While this was present previously, it has increased in the interval and canal narrowing at the level of the odontoid process has significantly worsened in the interval. The canal measures 6.8 mm at this level today. An MRI could better assess the cord at this level. Pressure on the cord at this level could result in the reported symptoms of decreased ability to perform ADL's. I suspect this is most likely due to degenerative change or ligamentous laxity rather than traumatic change. 2. Chronic right frontal infarct. No acute intracranial abnormality. 3. No facial bone fracture identified. 4. Increased attenuation in the fat at the base of neck on the right is likely posttraumatic given history. No large hematoma seen in this region. No fractures seen in the adjacent bones. CT imaging in this region could further assess if clinically warranted. Findings called to Dr. Rhunette CroftNanavati. Electronically Signed   By: Gerome Samavid  Williams III M.D   On: 11/08/2016 14:57   Mr Cervical Spine Wo Contrast  Addendum Date: 11/09/2016   ADDENDUM REPORT: 11/09/2016 14:25 ADDENDUM: Study discussed by telephone with Dr. Gonzella Lexhungel on 11/09/2016 at 1416 hours. Electronically Signed   By: Odessa FlemingH  Hall M.D.   On: 11/09/2016 14:25   Result Date: 11/09/2016 CLINICAL DATA:  66 year old female with cerebral palsy. Progressive worsening generalized weakness. Abnormal upper cervical spine on recent CT. EXAM: MRI CERVICAL SPINE WITHOUT CONTRAST TECHNIQUE: Multiplanar, multisequence MR imaging of the cervical spine was performed. No intravenous contrast was administered. COMPARISON:  CT head face and cervical spine 11/08/2016. FINDINGS: Study is moderately degraded by motion artifact despite repeated imaging attempts. Alignment: Stable from the recent CT. Straightening of cervical lordosis. Anterior positioning of C1 relative to C2, which appears to be  multifactorial (see the next section). The atlanto dens interval appears relatively maintained as seen by CT. No other spondylolisthesis. Cervicothoracic junction alignment is within normal limits. Vertebrae: Platyspondyly. Superimposed very bulky anterior endplate osteophytes throughout the cervical spine. Superimposed bulky bony hypertrophy of the odontoid. No marrow edema or acute osseous abnormality identified. Visualized bone marrow signal is within normal limits. Cord: Abnormal spinal cord signal at the cervicomedullary junction/C1 (series 13, image 8). 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 Between the C2 and C6 levels spinal cord volume, signal, and morphology appears maintained. Abnormal T2 and STIR cord signal then at the C6-C7 level (series 13, image 9 and series 18, image 25. From the inferior C7 level into the upper thoracic spine cord signal and morphology appears maintained. Posterior Fossa, vertebral arteries, paraspinal tissues: Negative visualized brain parenchyma. Preserved carotid artery flow voids in the neck. No signal abnormality in the cervical spine ligamentous complex. No acute neck soft tissue findings are evident. Disc levels: Axial images are significantly motion degraded such that cervical degeneration was much better depicted on the recent CT. There is moderate to severe multifactorial spinal stenosis at the C1-odontoid level, related to the vertebral body morphology, subluxation, odontoid hypertrophy, and posterior ligamentous hypertrophy. Mild cervical spinal stenosis also at C2-C3, with up to severe degenerative foraminal stenosis worse on the right. No associated spinal cord mass effect. No other significant cervical spinal stenosis. IMPRESSION: 1. 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 2. Multifactorial moderate to severe spinal stenosis  at the C1-odontoid level with spinal cord  compression and associated C1 level myelomalacia. 3. 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 Electronically Signed: By: Odessa Fleming M.D. On: 11/09/2016 14:00   Dg Chest Port 1 View  Result Date: 11/08/2016 CLINICAL DATA:  History of cerebral palsy.  Nonverbal patient. EXAM: PORTABLE CHEST 1 VIEW COMPARISON:  06/17/2013 FINDINGS: Cardiomediastinal silhouette is normal. Mediastinal contours appear intact. There is no evidence of focal airspace consolidation, pleural effusion or pneumothorax. Bronchiectasis with mild peribronchial thickening in the right upper lobe. Osseous structures are without acute abnormality. Soft tissues are grossly normal. IMPRESSION: Bronchiectasis with mild peribronchial thickening in the right upper lobe. Electronically Signed   By: Ted Mcalpine M.D.   On: 11/08/2016 13:34   Ct Maxillofacial Wo Contrast  Result Date: 11/08/2016 CLINICAL DATA:  Multiple recent falls.  Decreased mental status. EXAM: CT HEAD WITHOUT CONTRAST CT MAXILLOFACIAL WITHOUT CONTRAST CT CERVICAL SPINE WITHOUT CONTRAST TECHNIQUE: Multidetector CT imaging of the head, cervical spine, and maxillofacial structures were performed using the standard protocol without intravenous contrast. Multiplanar CT image reconstructions of the cervical spine and maxillofacial structures were also generated. COMPARISON:  August 27, 2008 FINDINGS: CT HEAD FINDINGS Brain: No subdural, epidural, or subarachnoid hemorrhage. Cerebellum, brainstem, and basal cisterns are normal. Encephalomalacia is seen in the right frontal lobe, unchanged since 2010, consistent with remote infarct. No acute cortical ischemia or infarct is identified. The ventricles and sulci are unchanged within visualize limits. No mass effect or midline shift. Vascular: No hyperdense vessel or unexpected calcification. Skull: Normal. Negative for fracture or focal lesion. Other: None. CT MAXILLOFACIAL FINDINGS Osseous: No fracture or mandibular  dislocation. No destructive process. Orbits: Negative. No traumatic or inflammatory finding. Sinuses: Clear. Soft tissues: Negative. CT CERVICAL SPINE FINDINGS Alignment: The pre odontoid space is more narrow in the interval, due to increasing hypertrophy of the anterior arch of C1. There is straightening of normal lordosis which is similar in the interval. The right lateral mass of C1 appears to have slipped more anteriorly versus C2 in the interval. There was some anterolisthesis in this region previously but the listhesis measures 7 mm on series 19, image 19 today versus 3.7 mm previously. There is no significant anterolisthesis on the left at this level. No fractures are seen. Skull base and vertebrae: No fractures are identified. Soft tissues and spinal canal: No prevertebral soft tissue swelling identified. There is increased attenuation in the fat at the base of the neck on the right such as on series 17, image 17. 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 Increased canal narrowing is seen at the level of the odontoid process due to increased anterolisthesis of C1 versus C2 on the right. The canal measures 6.8 mm at this level. The diameter of the canal is otherwise similar in the interval. Disc levels: Severe multilevel degenerative changes with anterior osteophytes. Upper chest: Negative. Other: No other abnormalities. IMPRESSION: 1. No fracture identified in the cervical spine. However, C1 appears to be anterolisthesed versus C2, particularly on the right. While this was present previously, it has increased in the interval and canal narrowing at the level of the odontoid process has significantly worsened in the interval. The canal measures 6.8 mm at this level today. An MRI could better assess the cord at this level. Pressure on the cord at this level could result in the reported symptoms of decreased ability to perform ADL's. I suspect this is most likely due to degenerative change or ligamentous  laxity rather than traumatic change. 2. Chronic right frontal infarct. No acute intracranial abnormality. 3. No facial bone fracture identified. 4. Increased attenuation in the fat at the base of neck on the right is likely posttraumatic given history. No large hematoma seen in this region. No fractures seen in the adjacent bones. CT imaging in this region could further assess if clinically warranted. Findings called to Dr. Rhunette Croft. Electronically Signed   By: Gerome Sam III M.D   On: 11/08/2016 14:57    Lab Data:  CBC:  Recent Labs Lab 11/08/16 1302 11/09/16 0329  WBC 17.6* 14.5*  NEUTROABS 15.1* 10.5*  HGB 10.8* 7.4*  HCT 35.4* 25.2*  MCV 96.7 97.3  PLT 203 143*   Basic Metabolic Panel:  Recent Labs Lab 11/09/16 0329 11/09/16 1738 11/10/16 0205 11/11/16 0401 11/12/16 0631  NA 154* 152* 148* 148* 142  K 4.0 4.0 4.1 3.9 3.5  CL 125* 122* 120* 117* 113*  CO2 25 25 23 25 24   GLUCOSE 155* 131* 155* 146* 146*  BUN 36* 29* 27* 23* 17  CREATININE 1.35* 1.11* 0.96 0.93 0.88  CALCIUM 8.2* 8.7* 8.7* 8.9 8.5*   GFR: Estimated Creatinine Clearance: 53.4 mL/min (by C-G formula based on SCr of 0.88 mg/dL). Liver Function Tests:  Recent Labs Lab 11/08/16 1302 11/09/16 0329  AST 24 20  ALT 19 14  ALKPHOS 65 54  BILITOT 0.4 0.4  PROT 7.4 5.7*  ALBUMIN 3.3* 2.4*   No results for input(s): LIPASE, AMYLASE in the last 168 hours. No results for input(s): AMMONIA in the last 168 hours. Coagulation Profile:  Recent Labs Lab 11/09/16 0329  INR 1.38   Cardiac Enzymes: No results for input(s): CKTOTAL, CKMB, CKMBINDEX, TROPONINI in the last 168 hours. BNP (last 3 results) No results for input(s): PROBNP in the last 8760 hours. HbA1C: No results for input(s): HGBA1C in the last 72 hours. CBG:  Recent Labs Lab 11/09/16 0807 11/10/16 0800 11/11/16 0603 11/12/16 0613  GLUCAP 130* 131* 126* 141*   Lipid Profile: No results for input(s): CHOL, HDL, LDLCALC, TRIG,  CHOLHDL, LDLDIRECT in the last 72 hours. Thyroid Function Tests: No results for input(s): TSH, T4TOTAL, FREET4, T3FREE, THYROIDAB in the last 72 hours. Anemia Panel: No results for input(s): VITAMINB12, FOLATE, FERRITIN, TIBC, IRON, RETICCTPCT in the last 72 hours. Urine analysis:    Component Value Date/Time   COLORURINE AMBER (A) 11/08/2016 1251   APPEARANCEUR CLOUDY (A) 11/08/2016 1251   LABSPEC 1.021 11/08/2016 1251   PHURINE 5.0 11/08/2016 1251   GLUCOSEU 50 (A) 11/08/2016 1251   HGBUR SMALL (A) 11/08/2016 1251   BILIRUBINUR SMALL (A) 11/08/2016 1251   KETONESUR NEGATIVE 11/08/2016 1251   PROTEINUR 100 (A) 11/08/2016 1251   UROBILINOGEN 4.0 (H) 06/17/2013 1907   NITRITE NEGATIVE 11/08/2016 1251   LEUKOCYTESUR NEGATIVE 11/08/2016 1251     Vonette Grosso M.D. [facility] Hospitalist 11/12/2016, 11:16 AM  Pager: [phone] Between 7am to 7pm - call Pager - [phone]  After 7pm go to [email or web address] - password [email or web address]  Call night coverage person covering after 7pm

## 2016-11-12 NOTE — Progress Notes (Signed)
[facility])  Received request from [facility] for family interest in [facility] services at home after discharge. Chart reviewed and received report from Vinie Sill. Eligibility confirmed with [facility].   Met with patient's sister [name] at bedside to discuss hospice services, philosophy and team approach to care. [name] verbalized good understanding of information provided. She reports plan is for patient to likely return home tomorrow via [place]. [name] was given [facility] contact information during this visit. DME discussed and she requested hospital bed with split rails and over bed table. DME has been ordered from [facility]). [name] is the contact for DME. Confirmed [facility] has correct discharge address which is [address], [place], [place] [address]. This address is not in EPIC. Also updated [facility] on Patricia's phone number as it has changed.   Please send signed scripts for any medication patient does not already have including comfort medications. Please send signed out of facility DNR with patient.   RNCM Kelli aware of above.   [facility] Liaison will follow daily until discharge.   Thank you,  Erling Conte, LCSW [phone]

## 2016-11-13 LAB — BASIC METABOLIC PANEL
Anion gap: 8 (ref 5–15)
BUN: 18 mg/dL (ref 6–20)
CALCIUM: 8.7 mg/dL — AB (ref 8.9–10.3)
CO2: 25 mmol/L (ref 22–32)
CREATININE: 0.9 mg/dL (ref 0.44–1.00)
Chloride: 111 mmol/L (ref 101–111)
GFR calc Af Amer: >60 mL/min (ref >60)
GLUCOSE: 146 mg/dL — AB (ref 65–99)
Potassium: 3 mmol/L — ABNORMAL LOW (ref 3.5–5.1)
Sodium: 144 mmol/L (ref 135–145)

## 2016-11-13 LAB — CULTURE, BLOOD (ROUTINE X 2)
Culture: NO GROWTH
Culture: NO GROWTH
SPECIAL REQUESTS: ADEQUATE
SPECIAL REQUESTS: ADEQUATE

## 2016-11-13 LAB — GLUCOSE, CAPILLARY: GLUCOSE-CAPILLARY: 125 mg/dL — AB (ref 65–99)

## 2016-11-13 MED ORDER — BISACODYL 10 MG RE SUPP: 10.0000 mg | Freq: Every day | RECTAL | 3 refills | Status: AC | PRN

## 2016-11-13 MED ORDER — ENSURE ENLIVE PO LIQD: 237.0000 mL | Freq: Two times a day (BID) | ORAL | 12 refills | Status: AC

## 2016-11-13 MED ORDER — POTASSIUM CHLORIDE CRYS ER 20 MEQ PO TBCR
40.0000 meq | EXTENDED_RELEASE_TABLET | Freq: Once | ORAL | Status: AC
Start: 2016-11-13 — End: 2016-11-13
  Administered 2016-11-13: 40 meq via ORAL
  Filled 2016-11-13: qty 2

## 2016-11-13 MED ORDER — SENNA 8.6 MG PO TABS: 1.0000 | ORAL_TABLET | Freq: Every day | ORAL | 2 refills | Status: AC

## 2016-11-13 MED ORDER — HYDROCORTISONE NA SUCCINATE PF 100 MG IJ SOLR: 50.0000 mg | Freq: Two times a day (BID) | INTRAMUSCULAR | Status: DC

## 2016-11-13 NOTE — Progress Notes (Signed)
[facility]  RN  Per Harvin HazelKelli, [facility]RN, patient's sister received a T5 mattress at the home today.  [facility].  [facility] advised that she may qualify for Alternating pad/pressure mattress.  Kelli did request that I put in an order for that.  I sent a task to our Research scientist (physical sciences)equipment manager, Loistine SimasJewel Hughes, to request an APP mattress.    Thank you,  Adele BarthelAmy Evans, RN, BSN [facility] Hospital Liaison [phone]  All hospital liaisons are on AMION.

## 2016-11-13 NOTE — Progress Notes (Signed)
[facility]  RN   [facility] and with Elease Hashimotoatricia, equipment to be delivered this morning between 0900 am and 100 pm.  Patient does have AV scheduled at 700pm this evening with Trula Orehristina, [facility], RN.   Left message for Harvin HazelKelli, [facility]RN.   Thank you,  Adele BarthelAmy Evans, RN, BSN [facility]iaison [phone]  All hospital liaisons are now on AMION.

## 2016-11-13 NOTE — Discharge Summary (Signed)
Physician Discharge Summary   Patient ID: Tisha Cline MRN: [id number] DOB/AGE: Feb 29, 1951 66 y.o.  Admit date: 11/08/2016 Discharge date: 11/13/2016  Primary Care Physician:  Patient, No Pcp Per  Discharge Diagnoses:    . Sepsis ([place]) Cervical spinal cord compression C1-C2  Acute kidney injury   Cerebral palsy   Dehydration   Severe hypernatremia   Pressure injury of skin, sacral   History of CVA   Constipation   Consults:  Palliative care  Recommendations for Outpatient Follow-up:  1. Patient will be going home with home hospice, hospice and palliative care [place]   DIET: Regular diet, encourage more fluid hydration    Allergies:  No Known Allergies   DISCHARGE MEDICATIONS: Current Discharge Medication List    START taking these medications   Details  bisacodyl (DULCOLAX) 10 MG suppository Place 1 suppository (10 mg total) rectally daily as needed for moderate constipation. Qty: 30 suppository, Refills: 3    feeding supplement, ENSURE ENLIVE, (ENSURE ENLIVE) LIQD Take 237 mLs by mouth 2 (two) times daily between meals. Qty: 237 mL, Refills: 12    senna (SENOKOT) 8.6 MG TABS tablet Take 1 tablet (8.6 mg total) by mouth daily. For constipation Qty: 30 each, Refills: 2      CONTINUE these medications which have NOT CHANGED   Details  bisacodyl (DULCOLAX) 5 MG EC tablet Take 5 mg by mouth daily as needed for moderate constipation.    cholecalciferol (VITAMIN D) 1000 units tablet Take 1,000 Units by mouth daily.    clopidogrel (PLAVIX) 75 MG tablet Take 75 mg by mouth daily with breakfast.    donepezil (ARICEPT) 5 MG tablet Take 5 mg by mouth at bedtime.    Multiple Vitamin (MULTIVITAMIN) tablet Take 1 tablet by mouth daily.    thioridazine (MELLARIL) 50 MG tablet Take 50 mg by mouth 2 (two) times daily.          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 Patient was brought to ED for progressive generalized weakness, poor oral intake for past several weeks. Patient has shown a decline in her function since 2014 and has been fully dependent of her ADLs.  Recently she also has been having multiple falls when she is sitting on the edge of the bed or on a commode.  In the ED she was tachycardic, hypotensive and clinically appeared dehydrated. Head and cervical spine showed significant narrowing at the level of C2. She was also found to be septic with fever of 100.90 Fahrenheit, WBC 14.5 K, lactic acid 2.04. UA was positive for UTI. Chemistry showed sodium of 156, chloride 122,, BUN 48 and creatinine 2.38. She was admitted to the stepdown unit   Hospital Course:  Sepsis ([place]) - Possibly due to UTI, severe dehydration, sepsis physiology has resolved at the time of discharge. - Patient was placed on empiric Rocephin, urine culture and blood cultures have been negative to date - Patient was placed on gentle IV fluid hydration and hydrocortisone. Hydrocortisone was weaned prior to discharge. A.m. cortisol level was 16.9    Cervical spinal cord compression (HCC), cervical spinal stenosis - Severe with multifactorial stenosis at the level of C1 and C2 with spinal cord compression, C1 myelomalacia. 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 Cyndy Freeze. - She was not considered a candidate for surgery and would not improve her quality of life/ADLs - Palliative care was consulted. Patient's family had goals of care meeting with palliative care. She also met with eligibility of hospice given less than 6 months prognosis with progressive cord compression and functioning decline  - Plans currently per family to go home with hospice services    Acute renal failure  (ARF) ([place]) - Likely due to UTI, sepsis and severe dehydration - Improved    Dehydration, severe hypernatremia - Improving, secondary to dehydration and poor oral intake - Sodium 156 at the time of admission, improved to 144. Patient needs to stay hydrated, encourage more fluids, which however will be a challenge given cerebral palsy and overall functional decline.     Cerebral palsy (HCC) - At baseline    Pressure injury of skin - Sacral ulcer unstageable, wound care per nursing  History of CVA - Resumed Plavix, continue statin  Constipation - Continue Dulcolax suppository, difficult to give her pills or mag citrate, patient refuses    Day of Discharge BP (!) 94/53 (BP Location: Left Arm)   Pulse 86   Temp 97.8 F (36.6 C) (Oral)   Resp 18   Ht '5\' 3"'$  (1.6 m)   Wt 57.6 kg (126 lb 14.4 oz)   SpO2 100%   BMI 22.48 kg/m   Physical Exam: General: Alert and awake, cerebral palsy, no acute distress HEENT: anicteric sclera, pupils reactive to light and accommodation CVS: S1-S2 clear no murmur rubs or gallops Chest: clear to auscultation bilaterally, no wheezing rales or rhonchi Abdomen: soft nontender, nondistended, normal bowel sounds Extremities: no cyanosis, clubbing or edema noted bilaterally, limited mobility of right extremity    The results of significant diagnostics from this hospitalization (including imaging, microbiology, ancillary and laboratory) are listed below for reference.    LAB RESULTS: Basic Metabolic Panel:  Recent Labs Lab 11/12/16 0631 11/13/16 0808  NA 142 144  K 3.5 3.0*  CL 113* 111  CO2 24 25  GLUCOSE 146* 146*  BUN 17 18  CREATININE 0.88 0.90  CALCIUM 8.5* 8.7*   Liver Function Tests:  Recent Labs Lab 11/08/16 1302 11/09/16 0329  AST 24 20  ALT 19 14  ALKPHOS 65 54  BILITOT 0.4 0.4  PROT 7.4 5.7*  ALBUMIN 3.3* 2.4*   No results for input(s): LIPASE, AMYLASE in the last 168 hours. No results for input(s): AMMONIA  in the last 168 hours. CBC:  Recent Labs Lab 11/08/16 1302 11/09/16 0329  WBC 17.6* 14.5*  NEUTROABS 15.1* 10.5*  HGB 10.8* 7.4*  HCT 35.4* 25.2*  MCV 96.7 97.3  PLT 203 143*   Cardiac Enzymes: No results for input(s): CKTOTAL, CKMB, CKMBINDEX, TROPONINI in the last 168 hours. BNP: Invalid input(s): POCBNP CBG:  Recent Labs Lab 11/12/16 0613 11/13/16 0616  GLUCAP 141* 125*    Significant Diagnostic Studies:  Ct Head Wo Contrast  Result Date: 11/08/2016 CLINICAL DATA:  Multiple recent falls.  Decreased mental status. EXAM: CT HEAD WITHOUT CONTRAST CT MAXILLOFACIAL WITHOUT CONTRAST CT CERVICAL SPINE WITHOUT CONTRAST TECHNIQUE: Multidetector CT imaging of the head, cervical spine, and maxillofacial structures were performed using the standard protocol without intravenous contrast. Multiplanar CT image reconstructions of the cervical spine and maxillofacial structures were also generated. COMPARISON:  August 27, 2008 FINDINGS: CT HEAD FINDINGS Brain: No subdural, epidural, or subarachnoid hemorrhage. Cerebellum, brainstem, and basal cisterns are normal. Encephalomalacia is seen in  the right frontal lobe, unchanged since 2010, consistent with remote infarct. No acute cortical ischemia or infarct is identified. The ventricles and sulci are unchanged within visualize limits. No mass effect or midline shift. Vascular: No hyperdense vessel or unexpected calcification. Skull: Normal. Negative for fracture or focal lesion. Other: None. CT MAXILLOFACIAL FINDINGS Osseous: No fracture or mandibular dislocation. No destructive process. Orbits: Negative. No traumatic or inflammatory finding. Sinuses: Clear. Soft tissues: Negative. CT CERVICAL SPINE FINDINGS Alignment: The pre odontoid space is more narrow in the interval, due to increasing hypertrophy of the anterior arch of C1. There is straightening of normal lordosis which is similar in the interval. The right lateral mass of C1 appears to have  slipped more anteriorly versus C2 in the interval. There was some anterolisthesis in this region previously but the listhesis measures 7 mm on series 19, image 19 today versus 3.7 mm previously. There is no significant anterolisthesis on the left at this level. No fractures are seen. Skull base and vertebrae: No fractures are identified. Soft tissues and spinal canal: No prevertebral soft tissue swelling identified. There is increased attenuation in the fat at the base of the neck on the right such as on series 17, image 17. 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 Increased canal narrowing is seen at the level of the odontoid process due to increased anterolisthesis of C1 versus C2 on the right. The canal measures 6.8 mm at this level. The diameter of the canal is otherwise similar in the interval. Disc levels: Severe multilevel degenerative changes with anterior osteophytes. Upper chest: Negative. Other: No other abnormalities. IMPRESSION: 1. No fracture identified in the cervical spine. However, C1 appears to be anterolisthesed versus C2, particularly on the right. While this was present previously, it has increased in the interval and canal narrowing at the level of the odontoid process has significantly worsened in the interval. The canal measures 6.8 mm at this level today. An MRI could better assess the cord at this level. Pressure on the cord at this level could result in the reported symptoms of decreased ability to perform ADL's. I suspect this is most likely due to degenerative change or ligamentous laxity rather than traumatic change. 2. Chronic right frontal infarct. No acute intracranial abnormality. 3. No facial bone fracture identified. 4. Increased attenuation in the fat at the base of neck on the right is likely posttraumatic given history. No large hematoma seen in this region. No fractures seen in the adjacent bones. CT imaging in this region could further assess if clinically warranted.  Findings called to Dr. Rhunette Croft. Electronically Signed   By: Gerome Sam III M.D   On: 11/08/2016 14:57   Ct Cervical Spine Wo Contrast  Result Date: 11/08/2016 CLINICAL DATA:  Multiple recent falls.  Decreased mental status. EXAM: CT HEAD WITHOUT CONTRAST CT MAXILLOFACIAL WITHOUT CONTRAST CT CERVICAL SPINE WITHOUT CONTRAST TECHNIQUE: Multidetector CT imaging of the head, cervical spine, and maxillofacial structures were performed using the standard protocol without intravenous contrast. Multiplanar CT image reconstructions of the cervical spine and maxillofacial structures were also generated. COMPARISON:  August 27, 2008 FINDINGS: CT HEAD FINDINGS Brain: No subdural, epidural, or subarachnoid hemorrhage. Cerebellum, brainstem, and basal cisterns are normal. Encephalomalacia is seen in the right frontal lobe, unchanged since 2010, consistent with remote infarct. No acute cortical ischemia or infarct is identified. The ventricles and sulci are unchanged within visualize limits. No mass effect or midline shift. Vascular: No hyperdense vessel or unexpected calcification. Skull: Normal. Negative for  fracture or focal lesion. Other: None. CT MAXILLOFACIAL FINDINGS Osseous: No fracture or mandibular dislocation. No destructive process. Orbits: Negative. No traumatic or inflammatory finding. Sinuses: Clear. Soft tissues: Negative. CT CERVICAL SPINE FINDINGS Alignment: The pre odontoid space is more narrow in the interval, due to increasing hypertrophy of the anterior arch of C1. There is straightening of normal lordosis which is similar in the interval. The right lateral mass of C1 appears to have slipped more anteriorly versus C2 in the interval. There was some anterolisthesis in this region previously but the listhesis measures 7 mm on series 19, image 19 today versus 3.7 mm previously. There is no significant anterolisthesis on the left at this level. No fractures are seen. Skull base and vertebrae: No fractures  are identified. Soft tissues and spinal canal: No prevertebral soft tissue swelling identified. There is increased attenuation in the fat at the base of the neck on the right such as on series 17, image 17. 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 Increased canal narrowing is seen at the level of the odontoid process due to increased anterolisthesis of C1 versus C2 on the right. The canal measures 6.8 mm at this level. The diameter of the canal is otherwise similar in the interval. Disc levels: Severe multilevel degenerative changes with anterior osteophytes. Upper chest: Negative. Other: No other abnormalities. IMPRESSION: 1. No fracture identified in the cervical spine. However, C1 appears to be anterolisthesed versus C2, particularly on the right. While this was present previously, it has increased in the interval and canal narrowing at the level of the odontoid process has significantly worsened in the interval. The canal measures 6.8 mm at this level today. An MRI could better assess the cord at this level. Pressure on the cord at this level could result in the reported symptoms of decreased ability to perform ADL's. I suspect this is most likely due to degenerative change or ligamentous laxity rather than traumatic change. 2. Chronic right frontal infarct. No acute intracranial abnormality. 3. No facial bone fracture identified. 4. Increased attenuation in the fat at the base of neck on the right is likely posttraumatic given history. No large hematoma seen in this region. No fractures seen in the adjacent bones. CT imaging in this region could further assess if clinically warranted. Findings called to Dr. Kathrynn Humble. Electronically Signed   By: Dorise Bullion III M.D   On: 11/08/2016 14:57   Mr Cervical Spine Wo Contrast  Addendum Date: 11/09/2016   ADDENDUM REPORT: 11/09/2016 14:25 ADDENDUM: Study discussed by telephone with Dr. Clementeen Graham on 11/09/2016 at 1416 hours. Electronically Signed   By: Genevie Ann  M.D.   On: 11/09/2016 14:25   Result Date: 11/09/2016 CLINICAL DATA:  66 year old female with cerebral palsy. Progressive worsening generalized weakness. Abnormal upper cervical spine on recent CT. EXAM: MRI CERVICAL SPINE WITHOUT CONTRAST TECHNIQUE: Multiplanar, multisequence MR imaging of the cervical spine was performed. No intravenous contrast was administered. COMPARISON:  CT head face and cervical spine 11/08/2016. FINDINGS: Study is moderately degraded by motion artifact despite repeated imaging attempts. Alignment: Stable from the recent CT. Straightening of cervical lordosis. Anterior positioning of C1 relative to C2, which appears to be multifactorial (see the next section). The atlanto dens interval appears relatively maintained as seen by CT. No other spondylolisthesis. Cervicothoracic junction alignment is within normal limits. Vertebrae: Platyspondyly. Superimposed very bulky anterior endplate osteophytes throughout the cervical spine. Superimposed bulky bony hypertrophy of the odontoid. No marrow edema or acute osseous abnormality identified. Visualized bone  marrow signal is within normal limits. Cord: Abnormal spinal cord signal at the cervicomedullary junction/C1 (series 13, image 8). 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 Between the C2 and C6 levels spinal cord volume, signal, and morphology appears maintained. Abnormal T2 and STIR cord signal then at the C6-C7 level (series 13, image 9 and series 18, image 25. From the inferior C7 level into the upper thoracic spine cord signal and morphology appears maintained. Posterior Fossa, vertebral arteries, paraspinal tissues: Negative visualized brain parenchyma. Preserved carotid artery flow voids in the neck. No signal abnormality in the cervical spine ligamentous complex. No acute neck soft tissue findings are evident. Disc levels: Axial images are significantly motion degraded such that cervical degeneration was much better  depicted on the recent CT. There is moderate to severe multifactorial spinal stenosis at the C1-odontoid level, related to the vertebral body morphology, subluxation, odontoid hypertrophy, and posterior ligamentous hypertrophy. Mild cervical spinal stenosis also at C2-C3, with up to severe degenerative foraminal stenosis worse on the right. No associated spinal cord mass effect. No other significant cervical spinal stenosis. IMPRESSION: 1. 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 2. Multifactorial moderate to severe spinal stenosis at the C1-odontoid level with spinal cord compression and associated C1 level myelomalacia. 3. 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 Electronically Signed: By: Genevie Ann M.D. On: 11/09/2016 14:00   Dg Chest Port 1 View  Result Date: 11/08/2016 CLINICAL DATA:  History of cerebral palsy.  Nonverbal patient. EXAM: PORTABLE CHEST 1 VIEW COMPARISON:  06/17/2013 FINDINGS: Cardiomediastinal silhouette is normal. Mediastinal contours appear intact. There is no evidence of focal airspace consolidation, pleural effusion or pneumothorax. Bronchiectasis with mild peribronchial thickening in the right upper lobe. Osseous structures are without acute abnormality. Soft tissues are grossly normal. IMPRESSION: Bronchiectasis with mild peribronchial thickening in the right upper lobe. Electronically Signed   By: Fidela Salisbury M.D.   On: 11/08/2016 13:34   Ct Maxillofacial Wo Contrast  Result Date: 11/08/2016 CLINICAL DATA:  Multiple recent falls.  Decreased mental status. EXAM: CT HEAD WITHOUT CONTRAST CT MAXILLOFACIAL WITHOUT CONTRAST CT CERVICAL SPINE WITHOUT CONTRAST TECHNIQUE: Multidetector CT imaging of the head, cervical spine, and maxillofacial structures were performed using the standard protocol without intravenous contrast. Multiplanar CT image reconstructions of the cervical spine and  maxillofacial structures were also generated. COMPARISON:  August 27, 2008 FINDINGS: CT HEAD FINDINGS Brain: No subdural, epidural, or subarachnoid hemorrhage. Cerebellum, brainstem, and basal cisterns are normal. Encephalomalacia is seen in the right frontal lobe, unchanged since 2010, consistent with remote infarct. No acute cortical ischemia or infarct is identified. The ventricles and sulci are unchanged within visualize limits. No mass effect or midline shift. Vascular: No hyperdense vessel or unexpected calcification. Skull: Normal. Negative for fracture or focal lesion. Other: None. CT MAXILLOFACIAL FINDINGS Osseous: No fracture or mandibular dislocation. No destructive process. Orbits: Negative. No traumatic or inflammatory finding. Sinuses: Clear. Soft tissues: Negative. CT CERVICAL SPINE FINDINGS Alignment: The pre odontoid space is more narrow in the interval, due to increasing hypertrophy of the anterior arch of C1. There is straightening of normal lordosis which is similar in the interval. The right lateral mass of C1 appears to have slipped more anteriorly versus C2 in the interval. There was some anterolisthesis in this region previously but the listhesis measures 7 mm on series 19, image 19 today versus 3.7 mm previously. There is no significant anterolisthesis on the left at this level.  No fractures are seen. Skull base and vertebrae: No fractures are identified. Soft tissues and spinal canal: No prevertebral soft tissue swelling identified. There is increased attenuation in the fat at the base of the neck on the right such as on series 17, image 17. 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 Increased canal narrowing is seen at the level of the odontoid process due to increased anterolisthesis of C1 versus C2 on the right. The canal measures 6.8 mm at this level. The diameter of the canal is otherwise similar in the interval. Disc levels: Severe multilevel degenerative changes with anterior  osteophytes. Upper chest: Negative. Other: No other abnormalities. IMPRESSION: 1. No fracture identified in the cervical spine. However, C1 appears to be anterolisthesed versus C2, particularly on the right. While this was present previously, it has increased in the interval and canal narrowing at the level of the odontoid process has significantly worsened in the interval. The canal measures 6.8 mm at this level today. An MRI could better assess the cord at this level. Pressure on the cord at this level could result in the reported symptoms of decreased ability to perform ADL's. I suspect this is most likely due to degenerative change or ligamentous laxity rather than traumatic change. 2. Chronic right frontal infarct. No acute intracranial abnormality. 3. No facial bone fracture identified. 4. Increased attenuation in the fat at the base of neck on the right is likely posttraumatic given history. No large hematoma seen in this region. No fractures seen in the adjacent bones. CT imaging in this region could further assess if clinically warranted. Findings called to Dr. Kathrynn Humble. Electronically Signed   By: Dorise Bullion III M.D   On: 11/08/2016 14:57    2D ECHO:   Disposition and Follow-up: Discharge Instructions    Diet general    Complete by:  As directed    Discharge instructions    Complete by:  As directed    Please stay hydrated.   Increase activity slowly    Complete by:  As directed        DISPOSITION: Home with hospice   DISCHARGE FOLLOW-UP Follow with her PCP in 2 weeks or as needed    Time spent on Discharge: 25 minutes  Signed:   Estill Cotta M.D. [facility] 11/13/2016, 12:23 PM Pager: [phone]

## 2016-11-13 NOTE — Care Management Note (Signed)
Case Management Note  Patient Details  Name: Cheyenne MoseMiriam Courtright MRN: [id number] Date of Birth: 07/03/1950  Subjective/Objective:                    Action/Plan: Pt discharging home with hospice care. CM has spoken to Amy with hospice and admitted RN will see the patient at home tonight at 7 pm.  CM spoke to patients sister, Dennie Bibleat, and the equipment is at the home.  CM asked about transportation home and they would like to use [facility]. CM called and arranged for transport via [facility]. Transportation form and DNR on the front of the patients chart.  Bedside RN updated.  Expected Discharge Date:  11/13/16               Expected Discharge Plan:  Home w Hospice Care  In-House Referral:     Discharge planning Services  CM Consult  Post Acute Care Choice:  Durable Medical Equipment, Hospice Choice offered to:  Sibling  DME Arranged:    DME Agency:     HH Arranged:    HH Agency:     Status of Service:  Completed, signed off  If discussed at [employer] Meetings, dates discussed:    Additional Comments:  Kermit BaloKelli F Nakeshia Waldeck, RN 11/13/2016, 4:37 PM

## 2016-11-13 NOTE — Progress Notes (Signed)
Patient discharged from room 5M03 at this time. Alert and in stable condition. IV site d/c'd as well as tele. Instructions read to sister and niece with understanding verbalized. Left unit via stretcher by [facility] with all belongings at side.

## 2017-01-24 IMAGING — US US EXTREM LOW VENOUS*R*
1 series · 13 of 24 positions shown · non-contrast
Comparison: None.

CLINICAL DATA: Right thigh swelling for the past week. Evaluate for
DVT.



[Series 1: us extrem low venous*right* · 0.08mm/px · 30 acquisitions, 13 frames shown]
[im 1/30]
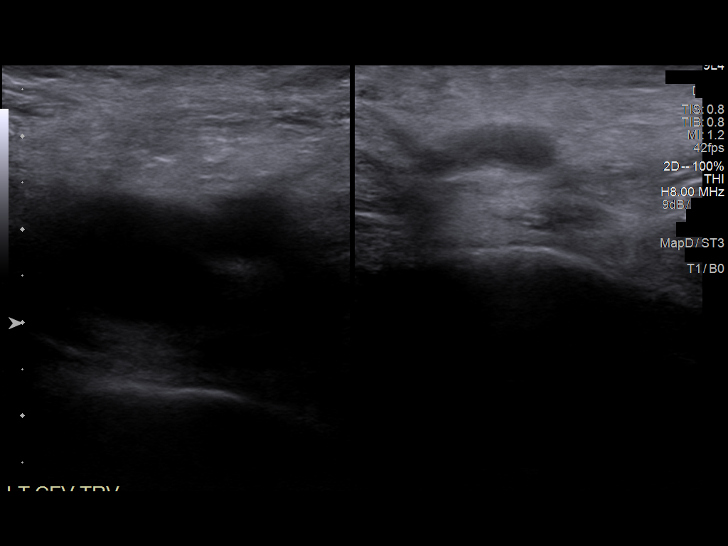
[im 3/30]
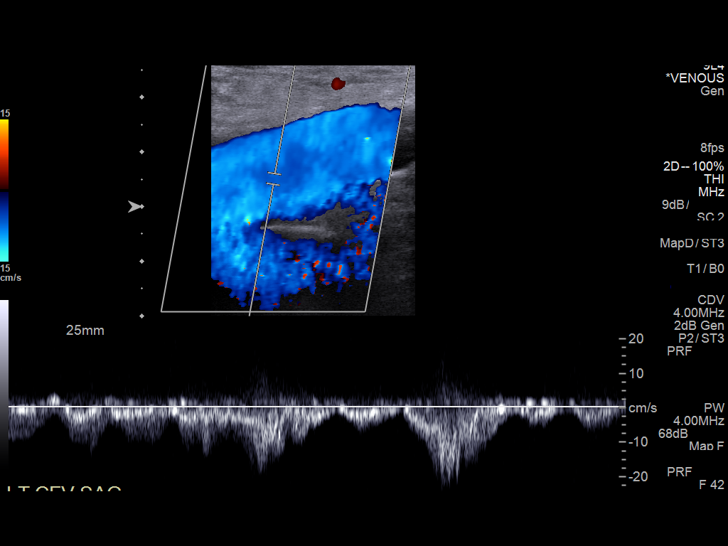
[im 6/30]
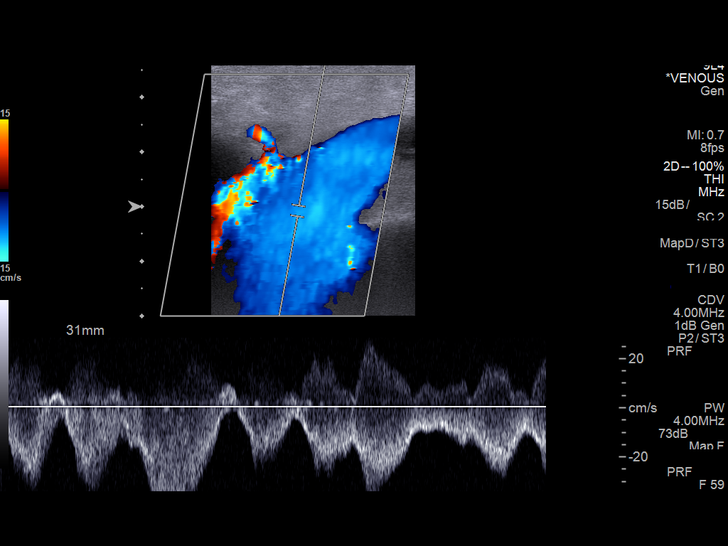
[im 9/30]
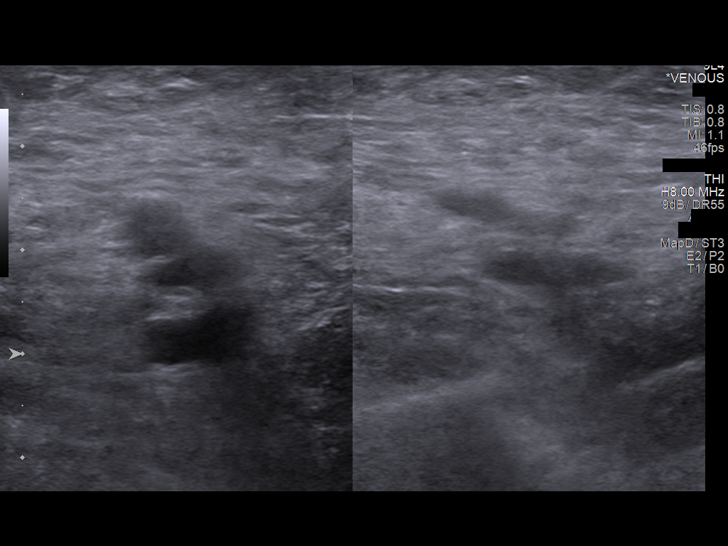
[im 12/30]
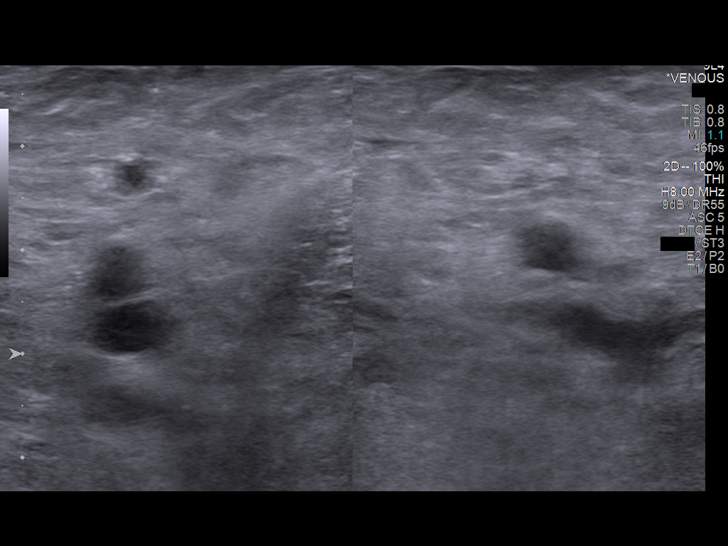
[im 14/30]
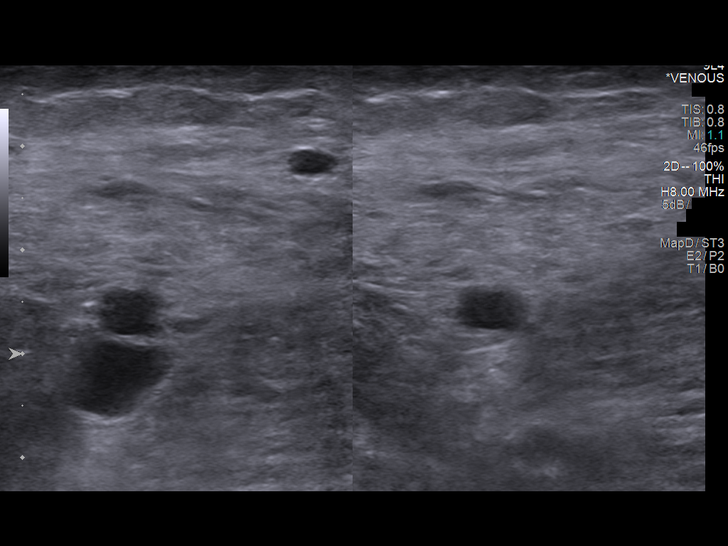
[im 17/30]
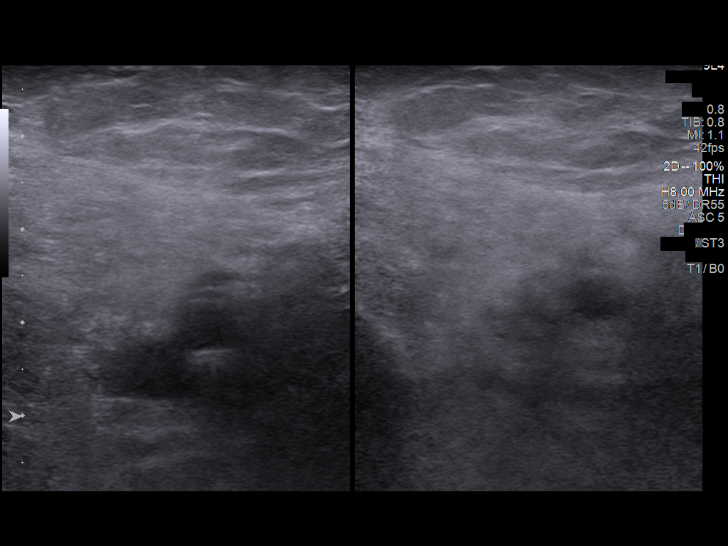
[im 18/30]
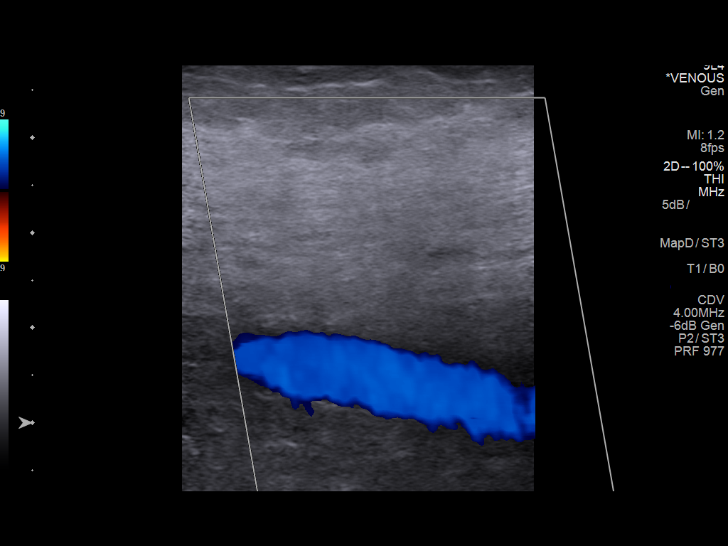
[im 21/30]
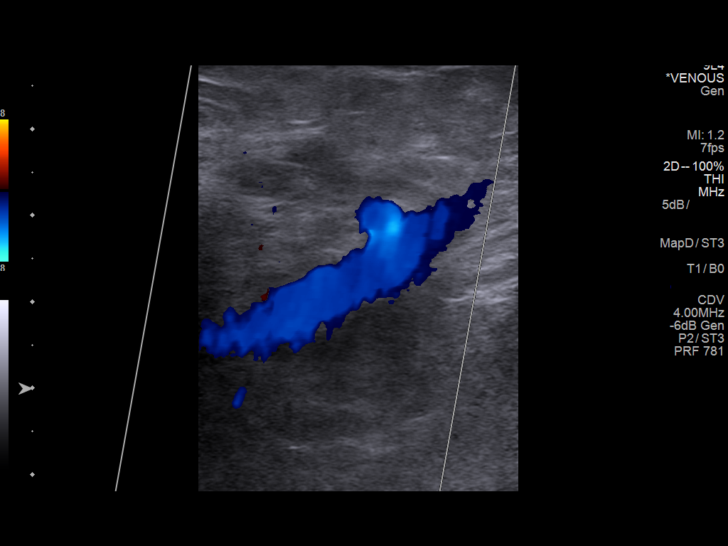
[im 23/30]
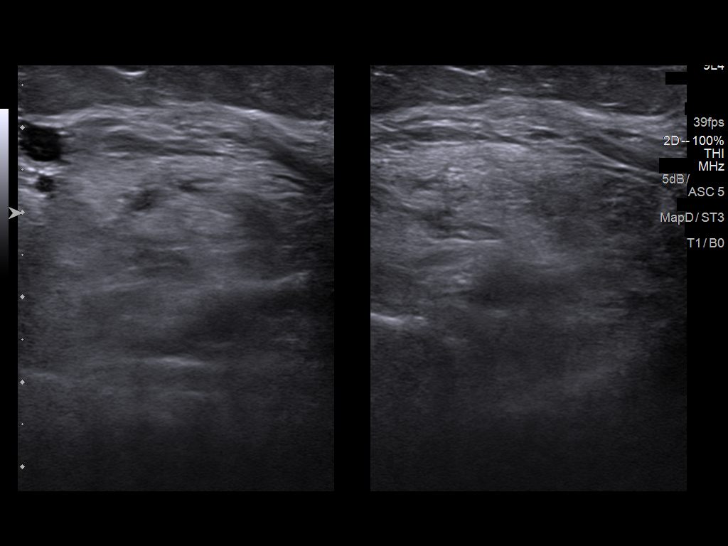
[im 27/30]
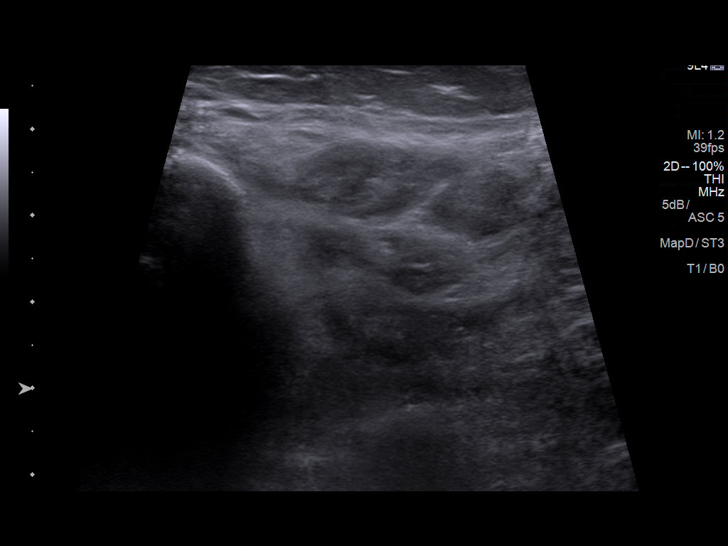
[im 28/30]
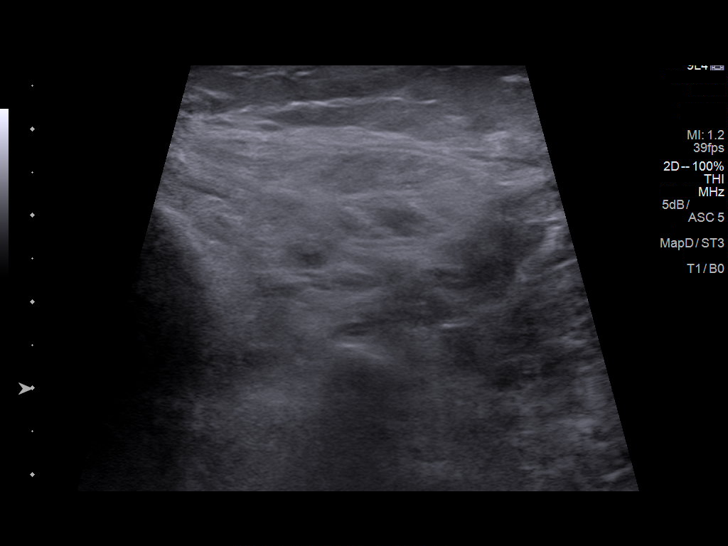
[im 30/30]
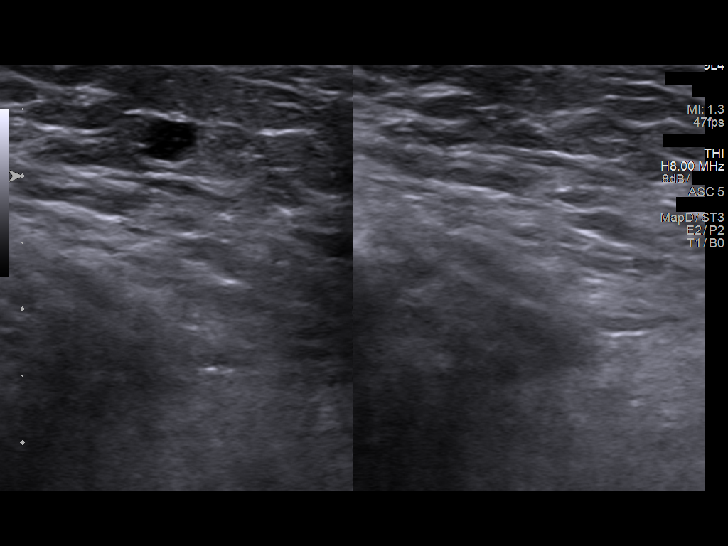

[13 of 24 positions shown; findings below may reference images not displayed]

FINDINGS: Contralateral Common Femoral Vein: Respiratory phasicity is normal
and symmetric with the symptomatic side. No evidence of thrombus.
Normal compressibility.

Common Femoral Vein: No evidence of thrombus. Normal
compressibility, respiratory phasicity and response to augmentation.
These findings were confirmed following discussion with the
performing sonographer by the dictating radiologist.

Saphenofemoral Junction: No evidence of thrombus. Normal
compressibility and flow on color Doppler imaging.

Profunda Femoral Vein: No evidence of thrombus. Normal
compressibility and flow on color Doppler imaging.

Femoral Vein: No evidence of thrombus. Normal compressibility,
respiratory phasicity and response to augmentation.

Popliteal Vein: No evidence of thrombus. Normal compressibility,
respiratory phasicity and response to augmentation.

Calf Veins: No evidence of thrombus. Normal compressibility and flow
on color Doppler imaging.

Superficial Great Saphenous Vein: No evidence of thrombus. Normal
compressibility and flow on color Doppler imaging.

Venous Reflux:  None.

Other Findings:  None.
IMPRESSION: No definite evidence of DVT within the right lower extremity.

## 2017-03-12 DIAGNOSIS — L8990 Pressure ulcer of unspecified site, unspecified stage: Secondary | ICD-10-CM | POA: Diagnosis not present

## 2017-03-12 DIAGNOSIS — G809 Cerebral palsy, unspecified: Secondary | ICD-10-CM | POA: Diagnosis not present

## 2017-03-12 DIAGNOSIS — I679 Cerebrovascular disease, unspecified: Secondary | ICD-10-CM | POA: Diagnosis not present

## 2017-03-12 DIAGNOSIS — M4800 Spinal stenosis, site unspecified: Secondary | ICD-10-CM | POA: Diagnosis not present

## 2017-03-19 DIAGNOSIS — G809 Cerebral palsy, unspecified: Secondary | ICD-10-CM | POA: Diagnosis not present

## 2017-03-19 DIAGNOSIS — I679 Cerebrovascular disease, unspecified: Secondary | ICD-10-CM | POA: Diagnosis not present

## 2017-03-19 DIAGNOSIS — L8990 Pressure ulcer of unspecified site, unspecified stage: Secondary | ICD-10-CM | POA: Diagnosis not present

## 2017-03-19 DIAGNOSIS — M4800 Spinal stenosis, site unspecified: Secondary | ICD-10-CM | POA: Diagnosis not present

## 2017-03-25 DIAGNOSIS — L8990 Pressure ulcer of unspecified site, unspecified stage: Secondary | ICD-10-CM | POA: Diagnosis not present

## 2017-03-25 DIAGNOSIS — I679 Cerebrovascular disease, unspecified: Secondary | ICD-10-CM | POA: Diagnosis not present

## 2017-03-25 DIAGNOSIS — M4800 Spinal stenosis, site unspecified: Secondary | ICD-10-CM | POA: Diagnosis not present

## 2017-03-25 DIAGNOSIS — G809 Cerebral palsy, unspecified: Secondary | ICD-10-CM | POA: Diagnosis not present

## 2017-03-30 DIAGNOSIS — L8990 Pressure ulcer of unspecified site, unspecified stage: Secondary | ICD-10-CM | POA: Diagnosis not present

## 2017-03-30 DIAGNOSIS — G809 Cerebral palsy, unspecified: Secondary | ICD-10-CM | POA: Diagnosis not present

## 2017-03-30 DIAGNOSIS — M4800 Spinal stenosis, site unspecified: Secondary | ICD-10-CM | POA: Diagnosis not present

## 2017-03-30 DIAGNOSIS — I679 Cerebrovascular disease, unspecified: Secondary | ICD-10-CM | POA: Diagnosis not present

## 2017-03-31 DIAGNOSIS — M4800 Spinal stenosis, site unspecified: Secondary | ICD-10-CM | POA: Diagnosis not present

## 2017-03-31 DIAGNOSIS — G809 Cerebral palsy, unspecified: Secondary | ICD-10-CM | POA: Diagnosis not present

## 2017-03-31 DIAGNOSIS — I679 Cerebrovascular disease, unspecified: Secondary | ICD-10-CM | POA: Diagnosis not present

## 2017-03-31 DIAGNOSIS — L8990 Pressure ulcer of unspecified site, unspecified stage: Secondary | ICD-10-CM | POA: Diagnosis not present

## 2017-04-07 DIAGNOSIS — G809 Cerebral palsy, unspecified: Secondary | ICD-10-CM | POA: Diagnosis not present

## 2017-04-07 DIAGNOSIS — M4800 Spinal stenosis, site unspecified: Secondary | ICD-10-CM | POA: Diagnosis not present

## 2017-04-07 DIAGNOSIS — I679 Cerebrovascular disease, unspecified: Secondary | ICD-10-CM | POA: Diagnosis not present

## 2017-04-07 DIAGNOSIS — L8990 Pressure ulcer of unspecified site, unspecified stage: Secondary | ICD-10-CM | POA: Diagnosis not present

## 2017-04-08 DIAGNOSIS — M4800 Spinal stenosis, site unspecified: Secondary | ICD-10-CM | POA: Diagnosis not present

## 2017-04-08 DIAGNOSIS — G809 Cerebral palsy, unspecified: Secondary | ICD-10-CM | POA: Diagnosis not present

## 2017-04-08 DIAGNOSIS — L8990 Pressure ulcer of unspecified site, unspecified stage: Secondary | ICD-10-CM | POA: Diagnosis not present

## 2017-04-08 DIAGNOSIS — I679 Cerebrovascular disease, unspecified: Secondary | ICD-10-CM | POA: Diagnosis not present

## 2017-04-11 DIAGNOSIS — G809 Cerebral palsy, unspecified: Secondary | ICD-10-CM | POA: Diagnosis not present

## 2017-04-11 DIAGNOSIS — L8990 Pressure ulcer of unspecified site, unspecified stage: Secondary | ICD-10-CM | POA: Diagnosis not present

## 2017-04-11 DIAGNOSIS — I679 Cerebrovascular disease, unspecified: Secondary | ICD-10-CM | POA: Diagnosis not present

## 2017-04-11 DIAGNOSIS — M4800 Spinal stenosis, site unspecified: Secondary | ICD-10-CM | POA: Diagnosis not present

## 2017-04-13 DIAGNOSIS — M4800 Spinal stenosis, site unspecified: Secondary | ICD-10-CM | POA: Diagnosis not present

## 2017-04-13 DIAGNOSIS — G809 Cerebral palsy, unspecified: Secondary | ICD-10-CM | POA: Diagnosis not present

## 2017-04-13 DIAGNOSIS — L8990 Pressure ulcer of unspecified site, unspecified stage: Secondary | ICD-10-CM | POA: Diagnosis not present

## 2017-04-13 DIAGNOSIS — I679 Cerebrovascular disease, unspecified: Secondary | ICD-10-CM | POA: Diagnosis not present

## 2017-04-15 DIAGNOSIS — L8990 Pressure ulcer of unspecified site, unspecified stage: Secondary | ICD-10-CM | POA: Diagnosis not present

## 2017-04-15 DIAGNOSIS — G809 Cerebral palsy, unspecified: Secondary | ICD-10-CM | POA: Diagnosis not present

## 2017-04-15 DIAGNOSIS — M4800 Spinal stenosis, site unspecified: Secondary | ICD-10-CM | POA: Diagnosis not present

## 2017-04-15 DIAGNOSIS — I679 Cerebrovascular disease, unspecified: Secondary | ICD-10-CM | POA: Diagnosis not present

## 2017-04-17 DIAGNOSIS — M4800 Spinal stenosis, site unspecified: Secondary | ICD-10-CM | POA: Diagnosis not present

## 2017-04-17 DIAGNOSIS — G809 Cerebral palsy, unspecified: Secondary | ICD-10-CM | POA: Diagnosis not present

## 2017-04-17 DIAGNOSIS — I679 Cerebrovascular disease, unspecified: Secondary | ICD-10-CM | POA: Diagnosis not present

## 2017-04-17 DIAGNOSIS — L8990 Pressure ulcer of unspecified site, unspecified stage: Secondary | ICD-10-CM | POA: Diagnosis not present

## 2017-04-20 DIAGNOSIS — M4800 Spinal stenosis, site unspecified: Secondary | ICD-10-CM | POA: Diagnosis not present

## 2017-04-20 DIAGNOSIS — I679 Cerebrovascular disease, unspecified: Secondary | ICD-10-CM | POA: Diagnosis not present

## 2017-04-20 DIAGNOSIS — G809 Cerebral palsy, unspecified: Secondary | ICD-10-CM | POA: Diagnosis not present

## 2017-04-20 DIAGNOSIS — L8990 Pressure ulcer of unspecified site, unspecified stage: Secondary | ICD-10-CM | POA: Diagnosis not present

## 2017-04-22 DIAGNOSIS — M4800 Spinal stenosis, site unspecified: Secondary | ICD-10-CM | POA: Diagnosis not present

## 2017-04-22 DIAGNOSIS — G809 Cerebral palsy, unspecified: Secondary | ICD-10-CM | POA: Diagnosis not present

## 2017-04-22 DIAGNOSIS — I679 Cerebrovascular disease, unspecified: Secondary | ICD-10-CM | POA: Diagnosis not present

## 2017-04-22 DIAGNOSIS — L8990 Pressure ulcer of unspecified site, unspecified stage: Secondary | ICD-10-CM | POA: Diagnosis not present

## 2017-04-29 DIAGNOSIS — M4800 Spinal stenosis, site unspecified: Secondary | ICD-10-CM | POA: Diagnosis not present

## 2017-04-29 DIAGNOSIS — G809 Cerebral palsy, unspecified: Secondary | ICD-10-CM | POA: Diagnosis not present

## 2017-04-29 DIAGNOSIS — L8990 Pressure ulcer of unspecified site, unspecified stage: Secondary | ICD-10-CM | POA: Diagnosis not present

## 2017-04-29 DIAGNOSIS — I679 Cerebrovascular disease, unspecified: Secondary | ICD-10-CM | POA: Diagnosis not present

## 2017-05-07 DIAGNOSIS — G809 Cerebral palsy, unspecified: Secondary | ICD-10-CM | POA: Diagnosis not present

## 2017-05-07 DIAGNOSIS — M4800 Spinal stenosis, site unspecified: Secondary | ICD-10-CM | POA: Diagnosis not present

## 2017-05-07 DIAGNOSIS — L8990 Pressure ulcer of unspecified site, unspecified stage: Secondary | ICD-10-CM | POA: Diagnosis not present

## 2017-05-07 DIAGNOSIS — I679 Cerebrovascular disease, unspecified: Secondary | ICD-10-CM | POA: Diagnosis not present

## 2017-05-12 DIAGNOSIS — L8990 Pressure ulcer of unspecified site, unspecified stage: Secondary | ICD-10-CM | POA: Diagnosis not present

## 2017-05-12 DIAGNOSIS — I679 Cerebrovascular disease, unspecified: Secondary | ICD-10-CM | POA: Diagnosis not present

## 2017-05-12 DIAGNOSIS — G809 Cerebral palsy, unspecified: Secondary | ICD-10-CM | POA: Diagnosis not present

## 2017-05-12 DIAGNOSIS — M4800 Spinal stenosis, site unspecified: Secondary | ICD-10-CM | POA: Diagnosis not present

## 2017-05-14 DIAGNOSIS — I679 Cerebrovascular disease, unspecified: Secondary | ICD-10-CM | POA: Diagnosis not present

## 2017-05-14 DIAGNOSIS — M4800 Spinal stenosis, site unspecified: Secondary | ICD-10-CM | POA: Diagnosis not present

## 2017-05-14 DIAGNOSIS — L8990 Pressure ulcer of unspecified site, unspecified stage: Secondary | ICD-10-CM | POA: Diagnosis not present

## 2017-05-14 DIAGNOSIS — G809 Cerebral palsy, unspecified: Secondary | ICD-10-CM | POA: Diagnosis not present

## 2017-05-15 DIAGNOSIS — L8990 Pressure ulcer of unspecified site, unspecified stage: Secondary | ICD-10-CM | POA: Diagnosis not present

## 2017-05-15 DIAGNOSIS — G809 Cerebral palsy, unspecified: Secondary | ICD-10-CM | POA: Diagnosis not present

## 2017-05-15 DIAGNOSIS — M4800 Spinal stenosis, site unspecified: Secondary | ICD-10-CM | POA: Diagnosis not present

## 2017-05-15 DIAGNOSIS — I679 Cerebrovascular disease, unspecified: Secondary | ICD-10-CM | POA: Diagnosis not present

## 2017-05-17 DIAGNOSIS — I679 Cerebrovascular disease, unspecified: Secondary | ICD-10-CM | POA: Diagnosis not present

## 2017-05-17 DIAGNOSIS — L8990 Pressure ulcer of unspecified site, unspecified stage: Secondary | ICD-10-CM | POA: Diagnosis not present

## 2017-05-17 DIAGNOSIS — G809 Cerebral palsy, unspecified: Secondary | ICD-10-CM | POA: Diagnosis not present

## 2017-05-17 DIAGNOSIS — M4800 Spinal stenosis, site unspecified: Secondary | ICD-10-CM | POA: Diagnosis not present

## 2017-05-20 DIAGNOSIS — M4800 Spinal stenosis, site unspecified: Secondary | ICD-10-CM | POA: Diagnosis not present

## 2017-05-20 DIAGNOSIS — L8990 Pressure ulcer of unspecified site, unspecified stage: Secondary | ICD-10-CM | POA: Diagnosis not present

## 2017-05-20 DIAGNOSIS — I679 Cerebrovascular disease, unspecified: Secondary | ICD-10-CM | POA: Diagnosis not present

## 2017-05-20 DIAGNOSIS — G809 Cerebral palsy, unspecified: Secondary | ICD-10-CM | POA: Diagnosis not present

## 2017-05-27 DIAGNOSIS — I679 Cerebrovascular disease, unspecified: Secondary | ICD-10-CM | POA: Diagnosis not present

## 2017-05-27 DIAGNOSIS — L8990 Pressure ulcer of unspecified site, unspecified stage: Secondary | ICD-10-CM | POA: Diagnosis not present

## 2017-05-27 DIAGNOSIS — M4800 Spinal stenosis, site unspecified: Secondary | ICD-10-CM | POA: Diagnosis not present

## 2017-05-27 DIAGNOSIS — G809 Cerebral palsy, unspecified: Secondary | ICD-10-CM | POA: Diagnosis not present

## 2017-06-03 DIAGNOSIS — G809 Cerebral palsy, unspecified: Secondary | ICD-10-CM | POA: Diagnosis not present

## 2017-06-03 DIAGNOSIS — M4800 Spinal stenosis, site unspecified: Secondary | ICD-10-CM | POA: Diagnosis not present

## 2017-06-03 DIAGNOSIS — I679 Cerebrovascular disease, unspecified: Secondary | ICD-10-CM | POA: Diagnosis not present

## 2017-06-03 DIAGNOSIS — L8990 Pressure ulcer of unspecified site, unspecified stage: Secondary | ICD-10-CM | POA: Diagnosis not present

## 2017-06-10 DIAGNOSIS — G809 Cerebral palsy, unspecified: Secondary | ICD-10-CM | POA: Diagnosis not present

## 2017-06-10 DIAGNOSIS — I679 Cerebrovascular disease, unspecified: Secondary | ICD-10-CM | POA: Diagnosis not present

## 2017-06-10 DIAGNOSIS — M4800 Spinal stenosis, site unspecified: Secondary | ICD-10-CM | POA: Diagnosis not present

## 2017-06-10 DIAGNOSIS — L8990 Pressure ulcer of unspecified site, unspecified stage: Secondary | ICD-10-CM | POA: Diagnosis not present

## 2017-06-12 DIAGNOSIS — L8990 Pressure ulcer of unspecified site, unspecified stage: Secondary | ICD-10-CM | POA: Diagnosis not present

## 2017-06-12 DIAGNOSIS — M4800 Spinal stenosis, site unspecified: Secondary | ICD-10-CM | POA: Diagnosis not present

## 2017-06-12 DIAGNOSIS — I679 Cerebrovascular disease, unspecified: Secondary | ICD-10-CM | POA: Diagnosis not present

## 2017-06-12 DIAGNOSIS — G809 Cerebral palsy, unspecified: Secondary | ICD-10-CM | POA: Diagnosis not present

## 2017-06-17 DIAGNOSIS — M4800 Spinal stenosis, site unspecified: Secondary | ICD-10-CM | POA: Diagnosis not present

## 2017-06-17 DIAGNOSIS — L8990 Pressure ulcer of unspecified site, unspecified stage: Secondary | ICD-10-CM | POA: Diagnosis not present

## 2017-06-17 DIAGNOSIS — G809 Cerebral palsy, unspecified: Secondary | ICD-10-CM | POA: Diagnosis not present

## 2017-06-17 DIAGNOSIS — I679 Cerebrovascular disease, unspecified: Secondary | ICD-10-CM | POA: Diagnosis not present

## 2017-06-24 DIAGNOSIS — L8990 Pressure ulcer of unspecified site, unspecified stage: Secondary | ICD-10-CM | POA: Diagnosis not present

## 2017-06-24 DIAGNOSIS — M4800 Spinal stenosis, site unspecified: Secondary | ICD-10-CM | POA: Diagnosis not present

## 2017-06-24 DIAGNOSIS — I679 Cerebrovascular disease, unspecified: Secondary | ICD-10-CM | POA: Diagnosis not present

## 2017-06-24 DIAGNOSIS — G809 Cerebral palsy, unspecified: Secondary | ICD-10-CM | POA: Diagnosis not present

## 2017-06-30 DIAGNOSIS — L8990 Pressure ulcer of unspecified site, unspecified stage: Secondary | ICD-10-CM | POA: Diagnosis not present

## 2017-06-30 DIAGNOSIS — G809 Cerebral palsy, unspecified: Secondary | ICD-10-CM | POA: Diagnosis not present

## 2017-06-30 DIAGNOSIS — I679 Cerebrovascular disease, unspecified: Secondary | ICD-10-CM | POA: Diagnosis not present

## 2017-06-30 DIAGNOSIS — M4800 Spinal stenosis, site unspecified: Secondary | ICD-10-CM | POA: Diagnosis not present

## 2017-07-01 DIAGNOSIS — G809 Cerebral palsy, unspecified: Secondary | ICD-10-CM | POA: Diagnosis not present

## 2017-07-01 DIAGNOSIS — L8990 Pressure ulcer of unspecified site, unspecified stage: Secondary | ICD-10-CM | POA: Diagnosis not present

## 2017-07-01 DIAGNOSIS — M4800 Spinal stenosis, site unspecified: Secondary | ICD-10-CM | POA: Diagnosis not present

## 2017-07-01 DIAGNOSIS — I679 Cerebrovascular disease, unspecified: Secondary | ICD-10-CM | POA: Diagnosis not present

## 2017-07-09 DIAGNOSIS — M4800 Spinal stenosis, site unspecified: Secondary | ICD-10-CM | POA: Diagnosis not present

## 2017-07-09 DIAGNOSIS — G809 Cerebral palsy, unspecified: Secondary | ICD-10-CM | POA: Diagnosis not present

## 2017-07-09 DIAGNOSIS — I679 Cerebrovascular disease, unspecified: Secondary | ICD-10-CM | POA: Diagnosis not present

## 2017-07-09 DIAGNOSIS — L8990 Pressure ulcer of unspecified site, unspecified stage: Secondary | ICD-10-CM | POA: Diagnosis not present

## 2017-07-10 DIAGNOSIS — L8990 Pressure ulcer of unspecified site, unspecified stage: Secondary | ICD-10-CM | POA: Diagnosis not present

## 2017-07-10 DIAGNOSIS — M4800 Spinal stenosis, site unspecified: Secondary | ICD-10-CM | POA: Diagnosis not present

## 2017-07-10 DIAGNOSIS — I679 Cerebrovascular disease, unspecified: Secondary | ICD-10-CM | POA: Diagnosis not present

## 2017-07-10 DIAGNOSIS — G809 Cerebral palsy, unspecified: Secondary | ICD-10-CM | POA: Diagnosis not present

## 2017-07-17 DIAGNOSIS — M4800 Spinal stenosis, site unspecified: Secondary | ICD-10-CM | POA: Diagnosis not present

## 2017-07-17 DIAGNOSIS — I679 Cerebrovascular disease, unspecified: Secondary | ICD-10-CM | POA: Diagnosis not present

## 2017-07-17 DIAGNOSIS — L8990 Pressure ulcer of unspecified site, unspecified stage: Secondary | ICD-10-CM | POA: Diagnosis not present

## 2017-07-17 DIAGNOSIS — G809 Cerebral palsy, unspecified: Secondary | ICD-10-CM | POA: Diagnosis not present

## 2017-07-23 DIAGNOSIS — M4800 Spinal stenosis, site unspecified: Secondary | ICD-10-CM | POA: Diagnosis not present

## 2017-07-23 DIAGNOSIS — I679 Cerebrovascular disease, unspecified: Secondary | ICD-10-CM | POA: Diagnosis not present

## 2017-07-23 DIAGNOSIS — L8990 Pressure ulcer of unspecified site, unspecified stage: Secondary | ICD-10-CM | POA: Diagnosis not present

## 2017-07-23 DIAGNOSIS — G809 Cerebral palsy, unspecified: Secondary | ICD-10-CM | POA: Diagnosis not present

## 2017-07-30 DIAGNOSIS — I679 Cerebrovascular disease, unspecified: Secondary | ICD-10-CM | POA: Diagnosis not present

## 2017-07-30 DIAGNOSIS — G809 Cerebral palsy, unspecified: Secondary | ICD-10-CM | POA: Diagnosis not present

## 2017-07-30 DIAGNOSIS — L8990 Pressure ulcer of unspecified site, unspecified stage: Secondary | ICD-10-CM | POA: Diagnosis not present

## 2017-07-30 DIAGNOSIS — M4800 Spinal stenosis, site unspecified: Secondary | ICD-10-CM | POA: Diagnosis not present

## 2017-08-06 DIAGNOSIS — I679 Cerebrovascular disease, unspecified: Secondary | ICD-10-CM | POA: Diagnosis not present

## 2017-08-06 DIAGNOSIS — M4800 Spinal stenosis, site unspecified: Secondary | ICD-10-CM | POA: Diagnosis not present

## 2017-08-06 DIAGNOSIS — L8990 Pressure ulcer of unspecified site, unspecified stage: Secondary | ICD-10-CM | POA: Diagnosis not present

## 2017-08-06 DIAGNOSIS — G809 Cerebral palsy, unspecified: Secondary | ICD-10-CM | POA: Diagnosis not present

## 2017-08-10 DIAGNOSIS — I679 Cerebrovascular disease, unspecified: Secondary | ICD-10-CM | POA: Diagnosis not present

## 2017-08-10 DIAGNOSIS — M4800 Spinal stenosis, site unspecified: Secondary | ICD-10-CM | POA: Diagnosis not present

## 2017-08-10 DIAGNOSIS — G809 Cerebral palsy, unspecified: Secondary | ICD-10-CM | POA: Diagnosis not present

## 2017-08-10 DIAGNOSIS — L8990 Pressure ulcer of unspecified site, unspecified stage: Secondary | ICD-10-CM | POA: Diagnosis not present

## 2017-08-13 DIAGNOSIS — L8990 Pressure ulcer of unspecified site, unspecified stage: Secondary | ICD-10-CM | POA: Diagnosis not present

## 2017-08-13 DIAGNOSIS — G809 Cerebral palsy, unspecified: Secondary | ICD-10-CM | POA: Diagnosis not present

## 2017-08-13 DIAGNOSIS — M4800 Spinal stenosis, site unspecified: Secondary | ICD-10-CM | POA: Diagnosis not present

## 2017-08-13 DIAGNOSIS — I679 Cerebrovascular disease, unspecified: Secondary | ICD-10-CM | POA: Diagnosis not present

## 2017-08-20 DIAGNOSIS — M4800 Spinal stenosis, site unspecified: Secondary | ICD-10-CM | POA: Diagnosis not present

## 2017-08-20 DIAGNOSIS — G809 Cerebral palsy, unspecified: Secondary | ICD-10-CM | POA: Diagnosis not present

## 2017-08-20 DIAGNOSIS — I679 Cerebrovascular disease, unspecified: Secondary | ICD-10-CM | POA: Diagnosis not present

## 2017-08-20 DIAGNOSIS — L8990 Pressure ulcer of unspecified site, unspecified stage: Secondary | ICD-10-CM | POA: Diagnosis not present

## 2017-08-21 DIAGNOSIS — I679 Cerebrovascular disease, unspecified: Secondary | ICD-10-CM | POA: Diagnosis not present

## 2017-08-21 DIAGNOSIS — M4800 Spinal stenosis, site unspecified: Secondary | ICD-10-CM | POA: Diagnosis not present

## 2017-08-21 DIAGNOSIS — G809 Cerebral palsy, unspecified: Secondary | ICD-10-CM | POA: Diagnosis not present

## 2017-08-21 DIAGNOSIS — L8990 Pressure ulcer of unspecified site, unspecified stage: Secondary | ICD-10-CM | POA: Diagnosis not present

## 2017-08-25 DIAGNOSIS — I679 Cerebrovascular disease, unspecified: Secondary | ICD-10-CM | POA: Diagnosis not present

## 2017-08-25 DIAGNOSIS — M4800 Spinal stenosis, site unspecified: Secondary | ICD-10-CM | POA: Diagnosis not present

## 2017-08-25 DIAGNOSIS — L8990 Pressure ulcer of unspecified site, unspecified stage: Secondary | ICD-10-CM | POA: Diagnosis not present

## 2017-08-25 DIAGNOSIS — G809 Cerebral palsy, unspecified: Secondary | ICD-10-CM | POA: Diagnosis not present

## 2017-08-26 DIAGNOSIS — M4800 Spinal stenosis, site unspecified: Secondary | ICD-10-CM | POA: Diagnosis not present

## 2017-08-26 DIAGNOSIS — L8990 Pressure ulcer of unspecified site, unspecified stage: Secondary | ICD-10-CM | POA: Diagnosis not present

## 2017-08-26 DIAGNOSIS — I679 Cerebrovascular disease, unspecified: Secondary | ICD-10-CM | POA: Diagnosis not present

## 2017-08-26 DIAGNOSIS — G809 Cerebral palsy, unspecified: Secondary | ICD-10-CM | POA: Diagnosis not present

## 2017-09-03 DIAGNOSIS — G809 Cerebral palsy, unspecified: Secondary | ICD-10-CM | POA: Diagnosis not present

## 2017-09-03 DIAGNOSIS — M4800 Spinal stenosis, site unspecified: Secondary | ICD-10-CM | POA: Diagnosis not present

## 2017-09-03 DIAGNOSIS — L8990 Pressure ulcer of unspecified site, unspecified stage: Secondary | ICD-10-CM | POA: Diagnosis not present

## 2017-09-03 DIAGNOSIS — I679 Cerebrovascular disease, unspecified: Secondary | ICD-10-CM | POA: Diagnosis not present

## 2017-09-09 DIAGNOSIS — I679 Cerebrovascular disease, unspecified: Secondary | ICD-10-CM | POA: Diagnosis not present

## 2017-09-09 DIAGNOSIS — G809 Cerebral palsy, unspecified: Secondary | ICD-10-CM | POA: Diagnosis not present

## 2017-09-09 DIAGNOSIS — M4800 Spinal stenosis, site unspecified: Secondary | ICD-10-CM | POA: Diagnosis not present

## 2017-09-09 DIAGNOSIS — L8990 Pressure ulcer of unspecified site, unspecified stage: Secondary | ICD-10-CM | POA: Diagnosis not present

## 2017-09-10 DIAGNOSIS — M4800 Spinal stenosis, site unspecified: Secondary | ICD-10-CM | POA: Diagnosis not present

## 2017-09-10 DIAGNOSIS — G809 Cerebral palsy, unspecified: Secondary | ICD-10-CM | POA: Diagnosis not present

## 2017-09-10 DIAGNOSIS — I679 Cerebrovascular disease, unspecified: Secondary | ICD-10-CM | POA: Diagnosis not present

## 2017-09-10 DIAGNOSIS — L8990 Pressure ulcer of unspecified site, unspecified stage: Secondary | ICD-10-CM | POA: Diagnosis not present

## 2017-09-17 DIAGNOSIS — M4800 Spinal stenosis, site unspecified: Secondary | ICD-10-CM | POA: Diagnosis not present

## 2017-09-17 DIAGNOSIS — L8990 Pressure ulcer of unspecified site, unspecified stage: Secondary | ICD-10-CM | POA: Diagnosis not present

## 2017-09-17 DIAGNOSIS — G809 Cerebral palsy, unspecified: Secondary | ICD-10-CM | POA: Diagnosis not present

## 2017-09-17 DIAGNOSIS — I679 Cerebrovascular disease, unspecified: Secondary | ICD-10-CM | POA: Diagnosis not present

## 2017-09-24 DIAGNOSIS — L8990 Pressure ulcer of unspecified site, unspecified stage: Secondary | ICD-10-CM | POA: Diagnosis not present

## 2017-09-24 DIAGNOSIS — I679 Cerebrovascular disease, unspecified: Secondary | ICD-10-CM | POA: Diagnosis not present

## 2017-09-24 DIAGNOSIS — G809 Cerebral palsy, unspecified: Secondary | ICD-10-CM | POA: Diagnosis not present

## 2017-09-24 DIAGNOSIS — M4800 Spinal stenosis, site unspecified: Secondary | ICD-10-CM | POA: Diagnosis not present

## 2017-09-25 DIAGNOSIS — I679 Cerebrovascular disease, unspecified: Secondary | ICD-10-CM | POA: Diagnosis not present

## 2017-09-25 DIAGNOSIS — G809 Cerebral palsy, unspecified: Secondary | ICD-10-CM | POA: Diagnosis not present

## 2017-09-25 DIAGNOSIS — M4800 Spinal stenosis, site unspecified: Secondary | ICD-10-CM | POA: Diagnosis not present

## 2017-09-25 DIAGNOSIS — L8990 Pressure ulcer of unspecified site, unspecified stage: Secondary | ICD-10-CM | POA: Diagnosis not present

## 2017-09-29 DIAGNOSIS — I679 Cerebrovascular disease, unspecified: Secondary | ICD-10-CM | POA: Diagnosis not present

## 2017-09-29 DIAGNOSIS — G809 Cerebral palsy, unspecified: Secondary | ICD-10-CM | POA: Diagnosis not present

## 2017-09-29 DIAGNOSIS — M4800 Spinal stenosis, site unspecified: Secondary | ICD-10-CM | POA: Diagnosis not present

## 2017-09-29 DIAGNOSIS — L8990 Pressure ulcer of unspecified site, unspecified stage: Secondary | ICD-10-CM | POA: Diagnosis not present

## 2017-10-01 DIAGNOSIS — G809 Cerebral palsy, unspecified: Secondary | ICD-10-CM | POA: Diagnosis not present

## 2017-10-01 DIAGNOSIS — F419 Anxiety disorder, unspecified: Secondary | ICD-10-CM | POA: Diagnosis not present

## 2017-10-01 DIAGNOSIS — K5909 Other constipation: Secondary | ICD-10-CM | POA: Diagnosis not present

## 2017-10-01 DIAGNOSIS — Z0000 Encounter for general adult medical examination without abnormal findings: Secondary | ICD-10-CM | POA: Diagnosis not present

## 2017-10-01 DIAGNOSIS — F0390 Unspecified dementia without behavioral disturbance: Secondary | ICD-10-CM | POA: Diagnosis not present

## 2017-10-01 DIAGNOSIS — I679 Cerebrovascular disease, unspecified: Secondary | ICD-10-CM | POA: Diagnosis not present

## 2017-10-01 DIAGNOSIS — M4800 Spinal stenosis, site unspecified: Secondary | ICD-10-CM | POA: Diagnosis not present

## 2017-10-01 DIAGNOSIS — L8990 Pressure ulcer of unspecified site, unspecified stage: Secondary | ICD-10-CM | POA: Diagnosis not present

## 2017-10-01 DIAGNOSIS — Z7401 Bed confinement status: Secondary | ICD-10-CM | POA: Diagnosis not present

## 2017-10-07 DIAGNOSIS — L8990 Pressure ulcer of unspecified site, unspecified stage: Secondary | ICD-10-CM | POA: Diagnosis not present

## 2017-10-07 DIAGNOSIS — M4800 Spinal stenosis, site unspecified: Secondary | ICD-10-CM | POA: Diagnosis not present

## 2017-10-07 DIAGNOSIS — G809 Cerebral palsy, unspecified: Secondary | ICD-10-CM | POA: Diagnosis not present

## 2017-10-07 DIAGNOSIS — I679 Cerebrovascular disease, unspecified: Secondary | ICD-10-CM | POA: Diagnosis not present

## 2017-10-08 DIAGNOSIS — I679 Cerebrovascular disease, unspecified: Secondary | ICD-10-CM | POA: Diagnosis not present

## 2017-10-08 DIAGNOSIS — G809 Cerebral palsy, unspecified: Secondary | ICD-10-CM | POA: Diagnosis not present

## 2017-10-08 DIAGNOSIS — M4800 Spinal stenosis, site unspecified: Secondary | ICD-10-CM | POA: Diagnosis not present

## 2017-10-08 DIAGNOSIS — L8990 Pressure ulcer of unspecified site, unspecified stage: Secondary | ICD-10-CM | POA: Diagnosis not present

## 2017-10-10 DIAGNOSIS — I679 Cerebrovascular disease, unspecified: Secondary | ICD-10-CM | POA: Diagnosis not present

## 2017-10-10 DIAGNOSIS — L8990 Pressure ulcer of unspecified site, unspecified stage: Secondary | ICD-10-CM | POA: Diagnosis not present

## 2017-10-10 DIAGNOSIS — M4800 Spinal stenosis, site unspecified: Secondary | ICD-10-CM | POA: Diagnosis not present

## 2017-10-10 DIAGNOSIS — G809 Cerebral palsy, unspecified: Secondary | ICD-10-CM | POA: Diagnosis not present

## 2017-10-12 DIAGNOSIS — I679 Cerebrovascular disease, unspecified: Secondary | ICD-10-CM | POA: Diagnosis not present

## 2017-10-12 DIAGNOSIS — L8990 Pressure ulcer of unspecified site, unspecified stage: Secondary | ICD-10-CM | POA: Diagnosis not present

## 2017-10-12 DIAGNOSIS — M4800 Spinal stenosis, site unspecified: Secondary | ICD-10-CM | POA: Diagnosis not present

## 2017-10-12 DIAGNOSIS — G809 Cerebral palsy, unspecified: Secondary | ICD-10-CM | POA: Diagnosis not present

## 2017-10-13 DIAGNOSIS — M4800 Spinal stenosis, site unspecified: Secondary | ICD-10-CM | POA: Diagnosis not present

## 2017-10-13 DIAGNOSIS — L8990 Pressure ulcer of unspecified site, unspecified stage: Secondary | ICD-10-CM | POA: Diagnosis not present

## 2017-10-13 DIAGNOSIS — I679 Cerebrovascular disease, unspecified: Secondary | ICD-10-CM | POA: Diagnosis not present

## 2017-10-13 DIAGNOSIS — G809 Cerebral palsy, unspecified: Secondary | ICD-10-CM | POA: Diagnosis not present

## 2017-10-14 DIAGNOSIS — M4800 Spinal stenosis, site unspecified: Secondary | ICD-10-CM | POA: Diagnosis not present

## 2017-10-14 DIAGNOSIS — G809 Cerebral palsy, unspecified: Secondary | ICD-10-CM | POA: Diagnosis not present

## 2017-10-14 DIAGNOSIS — I679 Cerebrovascular disease, unspecified: Secondary | ICD-10-CM | POA: Diagnosis not present

## 2017-10-14 DIAGNOSIS — L8990 Pressure ulcer of unspecified site, unspecified stage: Secondary | ICD-10-CM | POA: Diagnosis not present

## 2017-10-15 DIAGNOSIS — G809 Cerebral palsy, unspecified: Secondary | ICD-10-CM | POA: Diagnosis not present

## 2017-10-15 DIAGNOSIS — I679 Cerebrovascular disease, unspecified: Secondary | ICD-10-CM | POA: Diagnosis not present

## 2017-10-15 DIAGNOSIS — L8990 Pressure ulcer of unspecified site, unspecified stage: Secondary | ICD-10-CM | POA: Diagnosis not present

## 2017-10-15 DIAGNOSIS — M4800 Spinal stenosis, site unspecified: Secondary | ICD-10-CM | POA: Diagnosis not present

## 2017-10-16 DIAGNOSIS — M4800 Spinal stenosis, site unspecified: Secondary | ICD-10-CM | POA: Diagnosis not present

## 2017-10-16 DIAGNOSIS — G809 Cerebral palsy, unspecified: Secondary | ICD-10-CM | POA: Diagnosis not present

## 2017-10-16 DIAGNOSIS — I679 Cerebrovascular disease, unspecified: Secondary | ICD-10-CM | POA: Diagnosis not present

## 2017-10-16 DIAGNOSIS — L8990 Pressure ulcer of unspecified site, unspecified stage: Secondary | ICD-10-CM | POA: Diagnosis not present

## 2017-10-22 DIAGNOSIS — G809 Cerebral palsy, unspecified: Secondary | ICD-10-CM | POA: Diagnosis not present

## 2017-10-22 DIAGNOSIS — L8990 Pressure ulcer of unspecified site, unspecified stage: Secondary | ICD-10-CM | POA: Diagnosis not present

## 2017-10-22 DIAGNOSIS — I679 Cerebrovascular disease, unspecified: Secondary | ICD-10-CM | POA: Diagnosis not present

## 2017-10-22 DIAGNOSIS — M4800 Spinal stenosis, site unspecified: Secondary | ICD-10-CM | POA: Diagnosis not present

## 2017-10-24 DIAGNOSIS — G809 Cerebral palsy, unspecified: Secondary | ICD-10-CM | POA: Diagnosis not present

## 2017-10-24 DIAGNOSIS — Z7401 Bed confinement status: Secondary | ICD-10-CM | POA: Diagnosis not present

## 2017-10-29 DIAGNOSIS — M4800 Spinal stenosis, site unspecified: Secondary | ICD-10-CM | POA: Diagnosis not present

## 2017-10-29 DIAGNOSIS — G809 Cerebral palsy, unspecified: Secondary | ICD-10-CM | POA: Diagnosis not present

## 2017-10-29 DIAGNOSIS — I679 Cerebrovascular disease, unspecified: Secondary | ICD-10-CM | POA: Diagnosis not present

## 2017-10-29 DIAGNOSIS — L8990 Pressure ulcer of unspecified site, unspecified stage: Secondary | ICD-10-CM | POA: Diagnosis not present

## 2017-11-05 DIAGNOSIS — L8990 Pressure ulcer of unspecified site, unspecified stage: Secondary | ICD-10-CM | POA: Diagnosis not present

## 2017-11-05 DIAGNOSIS — I679 Cerebrovascular disease, unspecified: Secondary | ICD-10-CM | POA: Diagnosis not present

## 2017-11-05 DIAGNOSIS — M4800 Spinal stenosis, site unspecified: Secondary | ICD-10-CM | POA: Diagnosis not present

## 2017-11-05 DIAGNOSIS — G809 Cerebral palsy, unspecified: Secondary | ICD-10-CM | POA: Diagnosis not present

## 2017-11-09 DIAGNOSIS — M4800 Spinal stenosis, site unspecified: Secondary | ICD-10-CM | POA: Diagnosis not present

## 2017-11-09 DIAGNOSIS — G809 Cerebral palsy, unspecified: Secondary | ICD-10-CM | POA: Diagnosis not present

## 2017-11-09 DIAGNOSIS — I679 Cerebrovascular disease, unspecified: Secondary | ICD-10-CM | POA: Diagnosis not present

## 2017-11-09 DIAGNOSIS — L8990 Pressure ulcer of unspecified site, unspecified stage: Secondary | ICD-10-CM | POA: Diagnosis not present

## 2017-11-10 DIAGNOSIS — M4800 Spinal stenosis, site unspecified: Secondary | ICD-10-CM | POA: Diagnosis not present

## 2017-11-10 DIAGNOSIS — G809 Cerebral palsy, unspecified: Secondary | ICD-10-CM | POA: Diagnosis not present

## 2017-11-10 DIAGNOSIS — I679 Cerebrovascular disease, unspecified: Secondary | ICD-10-CM | POA: Diagnosis not present

## 2017-11-10 DIAGNOSIS — L8990 Pressure ulcer of unspecified site, unspecified stage: Secondary | ICD-10-CM | POA: Diagnosis not present

## 2017-11-11 DIAGNOSIS — I679 Cerebrovascular disease, unspecified: Secondary | ICD-10-CM | POA: Diagnosis not present

## 2017-11-11 DIAGNOSIS — M4800 Spinal stenosis, site unspecified: Secondary | ICD-10-CM | POA: Diagnosis not present

## 2017-11-11 DIAGNOSIS — G809 Cerebral palsy, unspecified: Secondary | ICD-10-CM | POA: Diagnosis not present

## 2017-11-11 DIAGNOSIS — L8990 Pressure ulcer of unspecified site, unspecified stage: Secondary | ICD-10-CM | POA: Diagnosis not present

## 2017-11-13 DIAGNOSIS — G809 Cerebral palsy, unspecified: Secondary | ICD-10-CM | POA: Diagnosis not present

## 2017-11-13 DIAGNOSIS — L8990 Pressure ulcer of unspecified site, unspecified stage: Secondary | ICD-10-CM | POA: Diagnosis not present

## 2017-11-13 DIAGNOSIS — M4800 Spinal stenosis, site unspecified: Secondary | ICD-10-CM | POA: Diagnosis not present

## 2017-11-13 DIAGNOSIS — I679 Cerebrovascular disease, unspecified: Secondary | ICD-10-CM | POA: Diagnosis not present

## 2017-11-17 DIAGNOSIS — L8990 Pressure ulcer of unspecified site, unspecified stage: Secondary | ICD-10-CM | POA: Diagnosis not present

## 2017-11-17 DIAGNOSIS — I679 Cerebrovascular disease, unspecified: Secondary | ICD-10-CM | POA: Diagnosis not present

## 2017-11-17 DIAGNOSIS — M4800 Spinal stenosis, site unspecified: Secondary | ICD-10-CM | POA: Diagnosis not present

## 2017-11-17 DIAGNOSIS — G809 Cerebral palsy, unspecified: Secondary | ICD-10-CM | POA: Diagnosis not present

## 2017-11-18 DIAGNOSIS — I679 Cerebrovascular disease, unspecified: Secondary | ICD-10-CM | POA: Diagnosis not present

## 2017-11-18 DIAGNOSIS — L8990 Pressure ulcer of unspecified site, unspecified stage: Secondary | ICD-10-CM | POA: Diagnosis not present

## 2017-11-18 DIAGNOSIS — M4800 Spinal stenosis, site unspecified: Secondary | ICD-10-CM | POA: Diagnosis not present

## 2017-11-18 DIAGNOSIS — G809 Cerebral palsy, unspecified: Secondary | ICD-10-CM | POA: Diagnosis not present

## 2017-11-19 DIAGNOSIS — L8990 Pressure ulcer of unspecified site, unspecified stage: Secondary | ICD-10-CM | POA: Diagnosis not present

## 2017-11-19 DIAGNOSIS — M4800 Spinal stenosis, site unspecified: Secondary | ICD-10-CM | POA: Diagnosis not present

## 2017-11-19 DIAGNOSIS — I679 Cerebrovascular disease, unspecified: Secondary | ICD-10-CM | POA: Diagnosis not present

## 2017-11-19 DIAGNOSIS — G809 Cerebral palsy, unspecified: Secondary | ICD-10-CM | POA: Diagnosis not present

## 2017-11-20 DIAGNOSIS — L8990 Pressure ulcer of unspecified site, unspecified stage: Secondary | ICD-10-CM | POA: Diagnosis not present

## 2017-11-20 DIAGNOSIS — G809 Cerebral palsy, unspecified: Secondary | ICD-10-CM | POA: Diagnosis not present

## 2017-11-20 DIAGNOSIS — M4800 Spinal stenosis, site unspecified: Secondary | ICD-10-CM | POA: Diagnosis not present

## 2017-11-20 DIAGNOSIS — I679 Cerebrovascular disease, unspecified: Secondary | ICD-10-CM | POA: Diagnosis not present

## 2017-11-24 DIAGNOSIS — G809 Cerebral palsy, unspecified: Secondary | ICD-10-CM | POA: Diagnosis not present

## 2017-11-24 DIAGNOSIS — M4800 Spinal stenosis, site unspecified: Secondary | ICD-10-CM | POA: Diagnosis not present

## 2017-11-24 DIAGNOSIS — L8990 Pressure ulcer of unspecified site, unspecified stage: Secondary | ICD-10-CM | POA: Diagnosis not present

## 2017-11-24 DIAGNOSIS — I679 Cerebrovascular disease, unspecified: Secondary | ICD-10-CM | POA: Diagnosis not present

## 2017-11-25 DIAGNOSIS — G809 Cerebral palsy, unspecified: Secondary | ICD-10-CM | POA: Diagnosis not present

## 2017-11-25 DIAGNOSIS — I679 Cerebrovascular disease, unspecified: Secondary | ICD-10-CM | POA: Diagnosis not present

## 2017-11-25 DIAGNOSIS — M4800 Spinal stenosis, site unspecified: Secondary | ICD-10-CM | POA: Diagnosis not present

## 2017-11-25 DIAGNOSIS — L8990 Pressure ulcer of unspecified site, unspecified stage: Secondary | ICD-10-CM | POA: Diagnosis not present

## 2017-11-26 DIAGNOSIS — L8990 Pressure ulcer of unspecified site, unspecified stage: Secondary | ICD-10-CM | POA: Diagnosis not present

## 2017-11-26 DIAGNOSIS — G809 Cerebral palsy, unspecified: Secondary | ICD-10-CM | POA: Diagnosis not present

## 2017-11-26 DIAGNOSIS — I679 Cerebrovascular disease, unspecified: Secondary | ICD-10-CM | POA: Diagnosis not present

## 2017-11-26 DIAGNOSIS — M4800 Spinal stenosis, site unspecified: Secondary | ICD-10-CM | POA: Diagnosis not present

## 2017-11-28 DIAGNOSIS — K5909 Other constipation: Secondary | ICD-10-CM | POA: Diagnosis not present

## 2017-11-28 DIAGNOSIS — F419 Anxiety disorder, unspecified: Secondary | ICD-10-CM | POA: Diagnosis not present

## 2017-11-28 DIAGNOSIS — Z7401 Bed confinement status: Secondary | ICD-10-CM | POA: Diagnosis not present

## 2017-11-28 DIAGNOSIS — G809 Cerebral palsy, unspecified: Secondary | ICD-10-CM | POA: Diagnosis not present

## 2017-12-01 DIAGNOSIS — G809 Cerebral palsy, unspecified: Secondary | ICD-10-CM | POA: Diagnosis not present

## 2017-12-01 DIAGNOSIS — L8990 Pressure ulcer of unspecified site, unspecified stage: Secondary | ICD-10-CM | POA: Diagnosis not present

## 2017-12-01 DIAGNOSIS — M4800 Spinal stenosis, site unspecified: Secondary | ICD-10-CM | POA: Diagnosis not present

## 2017-12-01 DIAGNOSIS — I679 Cerebrovascular disease, unspecified: Secondary | ICD-10-CM | POA: Diagnosis not present

## 2017-12-04 DIAGNOSIS — L8990 Pressure ulcer of unspecified site, unspecified stage: Secondary | ICD-10-CM | POA: Diagnosis not present

## 2017-12-04 DIAGNOSIS — M4800 Spinal stenosis, site unspecified: Secondary | ICD-10-CM | POA: Diagnosis not present

## 2017-12-04 DIAGNOSIS — G809 Cerebral palsy, unspecified: Secondary | ICD-10-CM | POA: Diagnosis not present

## 2017-12-04 DIAGNOSIS — I679 Cerebrovascular disease, unspecified: Secondary | ICD-10-CM | POA: Diagnosis not present

## 2017-12-09 DIAGNOSIS — L8990 Pressure ulcer of unspecified site, unspecified stage: Secondary | ICD-10-CM | POA: Diagnosis not present

## 2017-12-09 DIAGNOSIS — G809 Cerebral palsy, unspecified: Secondary | ICD-10-CM | POA: Diagnosis not present

## 2017-12-09 DIAGNOSIS — M4800 Spinal stenosis, site unspecified: Secondary | ICD-10-CM | POA: Diagnosis not present

## 2017-12-09 DIAGNOSIS — I679 Cerebrovascular disease, unspecified: Secondary | ICD-10-CM | POA: Diagnosis not present

## 2017-12-10 DIAGNOSIS — I679 Cerebrovascular disease, unspecified: Secondary | ICD-10-CM | POA: Diagnosis not present

## 2017-12-10 DIAGNOSIS — G809 Cerebral palsy, unspecified: Secondary | ICD-10-CM | POA: Diagnosis not present

## 2017-12-10 DIAGNOSIS — M4800 Spinal stenosis, site unspecified: Secondary | ICD-10-CM | POA: Diagnosis not present

## 2017-12-10 DIAGNOSIS — L8990 Pressure ulcer of unspecified site, unspecified stage: Secondary | ICD-10-CM | POA: Diagnosis not present

## 2017-12-15 DIAGNOSIS — L8990 Pressure ulcer of unspecified site, unspecified stage: Secondary | ICD-10-CM | POA: Diagnosis not present

## 2017-12-15 DIAGNOSIS — G809 Cerebral palsy, unspecified: Secondary | ICD-10-CM | POA: Diagnosis not present

## 2017-12-15 DIAGNOSIS — M4800 Spinal stenosis, site unspecified: Secondary | ICD-10-CM | POA: Diagnosis not present

## 2017-12-15 DIAGNOSIS — I679 Cerebrovascular disease, unspecified: Secondary | ICD-10-CM | POA: Diagnosis not present

## 2017-12-23 DIAGNOSIS — M4800 Spinal stenosis, site unspecified: Secondary | ICD-10-CM | POA: Diagnosis not present

## 2017-12-23 DIAGNOSIS — G809 Cerebral palsy, unspecified: Secondary | ICD-10-CM | POA: Diagnosis not present

## 2017-12-23 DIAGNOSIS — L8990 Pressure ulcer of unspecified site, unspecified stage: Secondary | ICD-10-CM | POA: Diagnosis not present

## 2017-12-23 DIAGNOSIS — I679 Cerebrovascular disease, unspecified: Secondary | ICD-10-CM | POA: Diagnosis not present

## 2017-12-24 DIAGNOSIS — L8990 Pressure ulcer of unspecified site, unspecified stage: Secondary | ICD-10-CM | POA: Diagnosis not present

## 2017-12-24 DIAGNOSIS — G809 Cerebral palsy, unspecified: Secondary | ICD-10-CM | POA: Diagnosis not present

## 2017-12-24 DIAGNOSIS — M4800 Spinal stenosis, site unspecified: Secondary | ICD-10-CM | POA: Diagnosis not present

## 2017-12-24 DIAGNOSIS — I679 Cerebrovascular disease, unspecified: Secondary | ICD-10-CM | POA: Diagnosis not present

## 2017-12-31 DIAGNOSIS — I679 Cerebrovascular disease, unspecified: Secondary | ICD-10-CM | POA: Diagnosis not present

## 2017-12-31 DIAGNOSIS — M4800 Spinal stenosis, site unspecified: Secondary | ICD-10-CM | POA: Diagnosis not present

## 2017-12-31 DIAGNOSIS — L8990 Pressure ulcer of unspecified site, unspecified stage: Secondary | ICD-10-CM | POA: Diagnosis not present

## 2017-12-31 DIAGNOSIS — G809 Cerebral palsy, unspecified: Secondary | ICD-10-CM | POA: Diagnosis not present

## 2018-01-02 DIAGNOSIS — Z7401 Bed confinement status: Secondary | ICD-10-CM | POA: Diagnosis not present

## 2018-01-02 DIAGNOSIS — G809 Cerebral palsy, unspecified: Secondary | ICD-10-CM | POA: Diagnosis not present

## 2018-01-02 DIAGNOSIS — K5909 Other constipation: Secondary | ICD-10-CM | POA: Diagnosis not present

## 2018-01-02 DIAGNOSIS — F419 Anxiety disorder, unspecified: Secondary | ICD-10-CM | POA: Diagnosis not present

## 2018-01-07 DIAGNOSIS — I679 Cerebrovascular disease, unspecified: Secondary | ICD-10-CM | POA: Diagnosis not present

## 2018-01-07 DIAGNOSIS — L8990 Pressure ulcer of unspecified site, unspecified stage: Secondary | ICD-10-CM | POA: Diagnosis not present

## 2018-01-07 DIAGNOSIS — G809 Cerebral palsy, unspecified: Secondary | ICD-10-CM | POA: Diagnosis not present

## 2018-01-07 DIAGNOSIS — M4800 Spinal stenosis, site unspecified: Secondary | ICD-10-CM | POA: Diagnosis not present

## 2018-01-10 DIAGNOSIS — G809 Cerebral palsy, unspecified: Secondary | ICD-10-CM | POA: Diagnosis not present

## 2018-01-10 DIAGNOSIS — I679 Cerebrovascular disease, unspecified: Secondary | ICD-10-CM | POA: Diagnosis not present

## 2018-01-10 DIAGNOSIS — M4800 Spinal stenosis, site unspecified: Secondary | ICD-10-CM | POA: Diagnosis not present

## 2018-01-10 DIAGNOSIS — L8990 Pressure ulcer of unspecified site, unspecified stage: Secondary | ICD-10-CM | POA: Diagnosis not present

## 2018-01-13 DIAGNOSIS — G809 Cerebral palsy, unspecified: Secondary | ICD-10-CM | POA: Diagnosis not present

## 2018-01-13 DIAGNOSIS — M4800 Spinal stenosis, site unspecified: Secondary | ICD-10-CM | POA: Diagnosis not present

## 2018-01-13 DIAGNOSIS — I679 Cerebrovascular disease, unspecified: Secondary | ICD-10-CM | POA: Diagnosis not present

## 2018-01-13 DIAGNOSIS — L8990 Pressure ulcer of unspecified site, unspecified stage: Secondary | ICD-10-CM | POA: Diagnosis not present

## 2018-01-21 DIAGNOSIS — I679 Cerebrovascular disease, unspecified: Secondary | ICD-10-CM | POA: Diagnosis not present

## 2018-01-21 DIAGNOSIS — G809 Cerebral palsy, unspecified: Secondary | ICD-10-CM | POA: Diagnosis not present

## 2018-01-21 DIAGNOSIS — L8990 Pressure ulcer of unspecified site, unspecified stage: Secondary | ICD-10-CM | POA: Diagnosis not present

## 2018-01-21 DIAGNOSIS — M4800 Spinal stenosis, site unspecified: Secondary | ICD-10-CM | POA: Diagnosis not present

## 2018-01-29 DIAGNOSIS — I679 Cerebrovascular disease, unspecified: Secondary | ICD-10-CM | POA: Diagnosis not present

## 2018-01-29 DIAGNOSIS — L8990 Pressure ulcer of unspecified site, unspecified stage: Secondary | ICD-10-CM | POA: Diagnosis not present

## 2018-01-29 DIAGNOSIS — G809 Cerebral palsy, unspecified: Secondary | ICD-10-CM | POA: Diagnosis not present

## 2018-01-29 DIAGNOSIS — M4800 Spinal stenosis, site unspecified: Secondary | ICD-10-CM | POA: Diagnosis not present

## 2018-02-02 DIAGNOSIS — L8990 Pressure ulcer of unspecified site, unspecified stage: Secondary | ICD-10-CM | POA: Diagnosis not present

## 2018-02-02 DIAGNOSIS — G809 Cerebral palsy, unspecified: Secondary | ICD-10-CM | POA: Diagnosis not present

## 2018-02-02 DIAGNOSIS — M4800 Spinal stenosis, site unspecified: Secondary | ICD-10-CM | POA: Diagnosis not present

## 2018-02-02 DIAGNOSIS — Z7401 Bed confinement status: Secondary | ICD-10-CM | POA: Diagnosis not present

## 2018-02-02 DIAGNOSIS — K5909 Other constipation: Secondary | ICD-10-CM | POA: Diagnosis not present

## 2018-02-02 DIAGNOSIS — I679 Cerebrovascular disease, unspecified: Secondary | ICD-10-CM | POA: Diagnosis not present

## 2018-02-02 DIAGNOSIS — F419 Anxiety disorder, unspecified: Secondary | ICD-10-CM | POA: Diagnosis not present

## 2018-02-09 DIAGNOSIS — I679 Cerebrovascular disease, unspecified: Secondary | ICD-10-CM | POA: Diagnosis not present

## 2018-02-09 DIAGNOSIS — L8990 Pressure ulcer of unspecified site, unspecified stage: Secondary | ICD-10-CM | POA: Diagnosis not present

## 2018-02-09 DIAGNOSIS — G809 Cerebral palsy, unspecified: Secondary | ICD-10-CM | POA: Diagnosis not present

## 2018-02-09 DIAGNOSIS — M4800 Spinal stenosis, site unspecified: Secondary | ICD-10-CM | POA: Diagnosis not present

## 2018-02-11 DIAGNOSIS — I679 Cerebrovascular disease, unspecified: Secondary | ICD-10-CM | POA: Diagnosis not present

## 2018-02-11 DIAGNOSIS — G809 Cerebral palsy, unspecified: Secondary | ICD-10-CM | POA: Diagnosis not present

## 2018-02-11 DIAGNOSIS — L8990 Pressure ulcer of unspecified site, unspecified stage: Secondary | ICD-10-CM | POA: Diagnosis not present

## 2018-02-11 DIAGNOSIS — M4800 Spinal stenosis, site unspecified: Secondary | ICD-10-CM | POA: Diagnosis not present

## 2018-02-17 DIAGNOSIS — M4800 Spinal stenosis, site unspecified: Secondary | ICD-10-CM | POA: Diagnosis not present

## 2018-02-17 DIAGNOSIS — G809 Cerebral palsy, unspecified: Secondary | ICD-10-CM | POA: Diagnosis not present

## 2018-02-17 DIAGNOSIS — L8990 Pressure ulcer of unspecified site, unspecified stage: Secondary | ICD-10-CM | POA: Diagnosis not present

## 2018-02-17 DIAGNOSIS — I679 Cerebrovascular disease, unspecified: Secondary | ICD-10-CM | POA: Diagnosis not present

## 2018-02-22 DIAGNOSIS — I679 Cerebrovascular disease, unspecified: Secondary | ICD-10-CM | POA: Diagnosis not present

## 2018-02-22 DIAGNOSIS — M4800 Spinal stenosis, site unspecified: Secondary | ICD-10-CM | POA: Diagnosis not present

## 2018-02-22 DIAGNOSIS — G809 Cerebral palsy, unspecified: Secondary | ICD-10-CM | POA: Diagnosis not present

## 2018-02-22 DIAGNOSIS — L8990 Pressure ulcer of unspecified site, unspecified stage: Secondary | ICD-10-CM | POA: Diagnosis not present

## 2018-02-24 DIAGNOSIS — I679 Cerebrovascular disease, unspecified: Secondary | ICD-10-CM | POA: Diagnosis not present

## 2018-02-24 DIAGNOSIS — M4800 Spinal stenosis, site unspecified: Secondary | ICD-10-CM | POA: Diagnosis not present

## 2018-02-24 DIAGNOSIS — L8990 Pressure ulcer of unspecified site, unspecified stage: Secondary | ICD-10-CM | POA: Diagnosis not present

## 2018-02-24 DIAGNOSIS — G809 Cerebral palsy, unspecified: Secondary | ICD-10-CM | POA: Diagnosis not present

## 2018-03-05 DIAGNOSIS — I679 Cerebrovascular disease, unspecified: Secondary | ICD-10-CM | POA: Diagnosis not present

## 2018-03-05 DIAGNOSIS — M4800 Spinal stenosis, site unspecified: Secondary | ICD-10-CM | POA: Diagnosis not present

## 2018-03-05 DIAGNOSIS — L8990 Pressure ulcer of unspecified site, unspecified stage: Secondary | ICD-10-CM | POA: Diagnosis not present

## 2018-03-05 DIAGNOSIS — G809 Cerebral palsy, unspecified: Secondary | ICD-10-CM | POA: Diagnosis not present

## 2018-03-11 DIAGNOSIS — L8990 Pressure ulcer of unspecified site, unspecified stage: Secondary | ICD-10-CM | POA: Diagnosis not present

## 2018-03-11 DIAGNOSIS — G809 Cerebral palsy, unspecified: Secondary | ICD-10-CM | POA: Diagnosis not present

## 2018-03-11 DIAGNOSIS — M4800 Spinal stenosis, site unspecified: Secondary | ICD-10-CM | POA: Diagnosis not present

## 2018-03-11 DIAGNOSIS — I679 Cerebrovascular disease, unspecified: Secondary | ICD-10-CM | POA: Diagnosis not present

## 2018-03-12 DIAGNOSIS — I679 Cerebrovascular disease, unspecified: Secondary | ICD-10-CM | POA: Diagnosis not present

## 2018-03-12 DIAGNOSIS — G809 Cerebral palsy, unspecified: Secondary | ICD-10-CM | POA: Diagnosis not present

## 2018-03-12 DIAGNOSIS — L8990 Pressure ulcer of unspecified site, unspecified stage: Secondary | ICD-10-CM | POA: Diagnosis not present

## 2018-03-12 DIAGNOSIS — M4800 Spinal stenosis, site unspecified: Secondary | ICD-10-CM | POA: Diagnosis not present

## 2018-03-18 DIAGNOSIS — L8990 Pressure ulcer of unspecified site, unspecified stage: Secondary | ICD-10-CM | POA: Diagnosis not present

## 2018-03-18 DIAGNOSIS — M4800 Spinal stenosis, site unspecified: Secondary | ICD-10-CM | POA: Diagnosis not present

## 2018-03-18 DIAGNOSIS — I679 Cerebrovascular disease, unspecified: Secondary | ICD-10-CM | POA: Diagnosis not present

## 2018-03-18 DIAGNOSIS — G809 Cerebral palsy, unspecified: Secondary | ICD-10-CM | POA: Diagnosis not present

## 2018-03-19 DIAGNOSIS — G809 Cerebral palsy, unspecified: Secondary | ICD-10-CM | POA: Diagnosis not present

## 2018-03-19 DIAGNOSIS — I679 Cerebrovascular disease, unspecified: Secondary | ICD-10-CM | POA: Diagnosis not present

## 2018-03-19 DIAGNOSIS — M4800 Spinal stenosis, site unspecified: Secondary | ICD-10-CM | POA: Diagnosis not present

## 2018-03-19 DIAGNOSIS — Z7401 Bed confinement status: Secondary | ICD-10-CM | POA: Diagnosis not present

## 2018-03-19 DIAGNOSIS — F419 Anxiety disorder, unspecified: Secondary | ICD-10-CM | POA: Diagnosis not present

## 2018-03-19 DIAGNOSIS — Z7902 Long term (current) use of antithrombotics/antiplatelets: Secondary | ICD-10-CM | POA: Diagnosis not present

## 2018-03-19 DIAGNOSIS — L8990 Pressure ulcer of unspecified site, unspecified stage: Secondary | ICD-10-CM | POA: Diagnosis not present

## 2018-03-19 DIAGNOSIS — K5909 Other constipation: Secondary | ICD-10-CM | POA: Diagnosis not present

## 2018-03-25 DIAGNOSIS — I679 Cerebrovascular disease, unspecified: Secondary | ICD-10-CM | POA: Diagnosis not present

## 2018-03-25 DIAGNOSIS — M4800 Spinal stenosis, site unspecified: Secondary | ICD-10-CM | POA: Diagnosis not present

## 2018-03-25 DIAGNOSIS — L8990 Pressure ulcer of unspecified site, unspecified stage: Secondary | ICD-10-CM | POA: Diagnosis not present

## 2018-03-25 DIAGNOSIS — G809 Cerebral palsy, unspecified: Secondary | ICD-10-CM | POA: Diagnosis not present

## 2018-03-31 DIAGNOSIS — M4800 Spinal stenosis, site unspecified: Secondary | ICD-10-CM | POA: Diagnosis not present

## 2018-03-31 DIAGNOSIS — I679 Cerebrovascular disease, unspecified: Secondary | ICD-10-CM | POA: Diagnosis not present

## 2018-03-31 DIAGNOSIS — L8990 Pressure ulcer of unspecified site, unspecified stage: Secondary | ICD-10-CM | POA: Diagnosis not present

## 2018-03-31 DIAGNOSIS — G809 Cerebral palsy, unspecified: Secondary | ICD-10-CM | POA: Diagnosis not present

## 2018-04-01 DIAGNOSIS — I679 Cerebrovascular disease, unspecified: Secondary | ICD-10-CM | POA: Diagnosis not present

## 2018-04-01 DIAGNOSIS — G809 Cerebral palsy, unspecified: Secondary | ICD-10-CM | POA: Diagnosis not present

## 2018-04-01 DIAGNOSIS — L8990 Pressure ulcer of unspecified site, unspecified stage: Secondary | ICD-10-CM | POA: Diagnosis not present

## 2018-04-01 DIAGNOSIS — M4800 Spinal stenosis, site unspecified: Secondary | ICD-10-CM | POA: Diagnosis not present

## 2018-04-07 DIAGNOSIS — G809 Cerebral palsy, unspecified: Secondary | ICD-10-CM | POA: Diagnosis not present

## 2018-04-07 DIAGNOSIS — L8990 Pressure ulcer of unspecified site, unspecified stage: Secondary | ICD-10-CM | POA: Diagnosis not present

## 2018-04-07 DIAGNOSIS — M4800 Spinal stenosis, site unspecified: Secondary | ICD-10-CM | POA: Diagnosis not present

## 2018-04-07 DIAGNOSIS — I679 Cerebrovascular disease, unspecified: Secondary | ICD-10-CM | POA: Diagnosis not present

## 2018-04-11 DIAGNOSIS — L8990 Pressure ulcer of unspecified site, unspecified stage: Secondary | ICD-10-CM | POA: Diagnosis not present

## 2018-04-11 DIAGNOSIS — I679 Cerebrovascular disease, unspecified: Secondary | ICD-10-CM | POA: Diagnosis not present

## 2018-04-11 DIAGNOSIS — G809 Cerebral palsy, unspecified: Secondary | ICD-10-CM | POA: Diagnosis not present

## 2018-04-11 DIAGNOSIS — M4800 Spinal stenosis, site unspecified: Secondary | ICD-10-CM | POA: Diagnosis not present

## 2018-04-14 DIAGNOSIS — L8990 Pressure ulcer of unspecified site, unspecified stage: Secondary | ICD-10-CM | POA: Diagnosis not present

## 2018-04-14 DIAGNOSIS — M4800 Spinal stenosis, site unspecified: Secondary | ICD-10-CM | POA: Diagnosis not present

## 2018-04-14 DIAGNOSIS — I679 Cerebrovascular disease, unspecified: Secondary | ICD-10-CM | POA: Diagnosis not present

## 2018-04-14 DIAGNOSIS — G809 Cerebral palsy, unspecified: Secondary | ICD-10-CM | POA: Diagnosis not present

## 2018-04-15 DIAGNOSIS — G809 Cerebral palsy, unspecified: Secondary | ICD-10-CM | POA: Diagnosis not present

## 2018-04-15 DIAGNOSIS — I679 Cerebrovascular disease, unspecified: Secondary | ICD-10-CM | POA: Diagnosis not present

## 2018-04-15 DIAGNOSIS — M4800 Spinal stenosis, site unspecified: Secondary | ICD-10-CM | POA: Diagnosis not present

## 2018-04-15 DIAGNOSIS — L8990 Pressure ulcer of unspecified site, unspecified stage: Secondary | ICD-10-CM | POA: Diagnosis not present

## 2018-04-21 DIAGNOSIS — M4800 Spinal stenosis, site unspecified: Secondary | ICD-10-CM | POA: Diagnosis not present

## 2018-04-21 DIAGNOSIS — G809 Cerebral palsy, unspecified: Secondary | ICD-10-CM | POA: Diagnosis not present

## 2018-04-21 DIAGNOSIS — I679 Cerebrovascular disease, unspecified: Secondary | ICD-10-CM | POA: Diagnosis not present

## 2018-04-21 DIAGNOSIS — L8990 Pressure ulcer of unspecified site, unspecified stage: Secondary | ICD-10-CM | POA: Diagnosis not present

## 2018-04-22 DIAGNOSIS — I679 Cerebrovascular disease, unspecified: Secondary | ICD-10-CM | POA: Diagnosis not present

## 2018-04-22 DIAGNOSIS — M4800 Spinal stenosis, site unspecified: Secondary | ICD-10-CM | POA: Diagnosis not present

## 2018-04-22 DIAGNOSIS — L8990 Pressure ulcer of unspecified site, unspecified stage: Secondary | ICD-10-CM | POA: Diagnosis not present

## 2018-04-22 DIAGNOSIS — G809 Cerebral palsy, unspecified: Secondary | ICD-10-CM | POA: Diagnosis not present

## 2018-04-23 DIAGNOSIS — L8990 Pressure ulcer of unspecified site, unspecified stage: Secondary | ICD-10-CM | POA: Diagnosis not present

## 2018-04-23 DIAGNOSIS — M4800 Spinal stenosis, site unspecified: Secondary | ICD-10-CM | POA: Diagnosis not present

## 2018-04-23 DIAGNOSIS — I679 Cerebrovascular disease, unspecified: Secondary | ICD-10-CM | POA: Diagnosis not present

## 2018-04-23 DIAGNOSIS — G809 Cerebral palsy, unspecified: Secondary | ICD-10-CM | POA: Diagnosis not present

## 2018-04-29 DIAGNOSIS — G809 Cerebral palsy, unspecified: Secondary | ICD-10-CM | POA: Diagnosis not present

## 2018-04-29 DIAGNOSIS — M4800 Spinal stenosis, site unspecified: Secondary | ICD-10-CM | POA: Diagnosis not present

## 2018-04-29 DIAGNOSIS — I679 Cerebrovascular disease, unspecified: Secondary | ICD-10-CM | POA: Diagnosis not present

## 2018-04-29 DIAGNOSIS — L8990 Pressure ulcer of unspecified site, unspecified stage: Secondary | ICD-10-CM | POA: Diagnosis not present

## 2018-04-30 DIAGNOSIS — G809 Cerebral palsy, unspecified: Secondary | ICD-10-CM | POA: Diagnosis not present

## 2018-04-30 DIAGNOSIS — M4800 Spinal stenosis, site unspecified: Secondary | ICD-10-CM | POA: Diagnosis not present

## 2018-04-30 DIAGNOSIS — L8990 Pressure ulcer of unspecified site, unspecified stage: Secondary | ICD-10-CM | POA: Diagnosis not present

## 2018-04-30 DIAGNOSIS — I679 Cerebrovascular disease, unspecified: Secondary | ICD-10-CM | POA: Diagnosis not present

## 2018-05-07 DIAGNOSIS — G809 Cerebral palsy, unspecified: Secondary | ICD-10-CM | POA: Diagnosis not present

## 2018-05-07 DIAGNOSIS — I679 Cerebrovascular disease, unspecified: Secondary | ICD-10-CM | POA: Diagnosis not present

## 2018-05-07 DIAGNOSIS — L8990 Pressure ulcer of unspecified site, unspecified stage: Secondary | ICD-10-CM | POA: Diagnosis not present

## 2018-05-07 DIAGNOSIS — M4800 Spinal stenosis, site unspecified: Secondary | ICD-10-CM | POA: Diagnosis not present

## 2018-05-08 DIAGNOSIS — I679 Cerebrovascular disease, unspecified: Secondary | ICD-10-CM | POA: Diagnosis not present

## 2018-05-08 DIAGNOSIS — G809 Cerebral palsy, unspecified: Secondary | ICD-10-CM | POA: Diagnosis not present

## 2018-05-08 DIAGNOSIS — M4800 Spinal stenosis, site unspecified: Secondary | ICD-10-CM | POA: Diagnosis not present

## 2018-05-08 DIAGNOSIS — L8990 Pressure ulcer of unspecified site, unspecified stage: Secondary | ICD-10-CM | POA: Diagnosis not present

## 2018-05-09 DIAGNOSIS — L8990 Pressure ulcer of unspecified site, unspecified stage: Secondary | ICD-10-CM | POA: Diagnosis not present

## 2018-05-09 DIAGNOSIS — I679 Cerebrovascular disease, unspecified: Secondary | ICD-10-CM | POA: Diagnosis not present

## 2018-05-09 DIAGNOSIS — G809 Cerebral palsy, unspecified: Secondary | ICD-10-CM | POA: Diagnosis not present

## 2018-05-09 DIAGNOSIS — M4800 Spinal stenosis, site unspecified: Secondary | ICD-10-CM | POA: Diagnosis not present

## 2018-05-10 DIAGNOSIS — M4800 Spinal stenosis, site unspecified: Secondary | ICD-10-CM | POA: Diagnosis not present

## 2018-05-10 DIAGNOSIS — L8990 Pressure ulcer of unspecified site, unspecified stage: Secondary | ICD-10-CM | POA: Diagnosis not present

## 2018-05-10 DIAGNOSIS — I679 Cerebrovascular disease, unspecified: Secondary | ICD-10-CM | POA: Diagnosis not present

## 2018-05-10 DIAGNOSIS — G809 Cerebral palsy, unspecified: Secondary | ICD-10-CM | POA: Diagnosis not present

## 2018-05-11 DIAGNOSIS — G809 Cerebral palsy, unspecified: Secondary | ICD-10-CM | POA: Diagnosis not present

## 2018-05-11 DIAGNOSIS — M4800 Spinal stenosis, site unspecified: Secondary | ICD-10-CM | POA: Diagnosis not present

## 2018-05-11 DIAGNOSIS — I679 Cerebrovascular disease, unspecified: Secondary | ICD-10-CM | POA: Diagnosis not present

## 2018-05-11 DIAGNOSIS — L8990 Pressure ulcer of unspecified site, unspecified stage: Secondary | ICD-10-CM | POA: Diagnosis not present

## 2018-05-12 DIAGNOSIS — M4800 Spinal stenosis, site unspecified: Secondary | ICD-10-CM | POA: Diagnosis not present

## 2018-05-12 DIAGNOSIS — I679 Cerebrovascular disease, unspecified: Secondary | ICD-10-CM | POA: Diagnosis not present

## 2018-05-12 DIAGNOSIS — L8990 Pressure ulcer of unspecified site, unspecified stage: Secondary | ICD-10-CM | POA: Diagnosis not present

## 2018-05-12 DIAGNOSIS — G809 Cerebral palsy, unspecified: Secondary | ICD-10-CM | POA: Diagnosis not present

## 2018-05-13 DIAGNOSIS — G809 Cerebral palsy, unspecified: Secondary | ICD-10-CM | POA: Diagnosis not present

## 2018-05-13 DIAGNOSIS — L8990 Pressure ulcer of unspecified site, unspecified stage: Secondary | ICD-10-CM | POA: Diagnosis not present

## 2018-05-13 DIAGNOSIS — M4800 Spinal stenosis, site unspecified: Secondary | ICD-10-CM | POA: Diagnosis not present

## 2018-05-13 DIAGNOSIS — I679 Cerebrovascular disease, unspecified: Secondary | ICD-10-CM | POA: Diagnosis not present

## 2018-05-14 DIAGNOSIS — M4800 Spinal stenosis, site unspecified: Secondary | ICD-10-CM | POA: Diagnosis not present

## 2018-05-14 DIAGNOSIS — I679 Cerebrovascular disease, unspecified: Secondary | ICD-10-CM | POA: Diagnosis not present

## 2018-05-14 DIAGNOSIS — G809 Cerebral palsy, unspecified: Secondary | ICD-10-CM | POA: Diagnosis not present

## 2018-05-14 DIAGNOSIS — L8990 Pressure ulcer of unspecified site, unspecified stage: Secondary | ICD-10-CM | POA: Diagnosis not present

## 2018-05-20 DIAGNOSIS — Z7401 Bed confinement status: Secondary | ICD-10-CM | POA: Diagnosis not present

## 2018-05-20 DIAGNOSIS — F0390 Unspecified dementia without behavioral disturbance: Secondary | ICD-10-CM | POA: Diagnosis not present

## 2018-05-20 DIAGNOSIS — F419 Anxiety disorder, unspecified: Secondary | ICD-10-CM | POA: Diagnosis not present

## 2018-05-20 DIAGNOSIS — M4800 Spinal stenosis, site unspecified: Secondary | ICD-10-CM | POA: Diagnosis not present

## 2018-05-20 DIAGNOSIS — L8990 Pressure ulcer of unspecified site, unspecified stage: Secondary | ICD-10-CM | POA: Diagnosis not present

## 2018-05-20 DIAGNOSIS — G809 Cerebral palsy, unspecified: Secondary | ICD-10-CM | POA: Diagnosis not present

## 2018-05-20 DIAGNOSIS — I679 Cerebrovascular disease, unspecified: Secondary | ICD-10-CM | POA: Diagnosis not present

## 2018-05-25 DIAGNOSIS — M4800 Spinal stenosis, site unspecified: Secondary | ICD-10-CM | POA: Diagnosis not present

## 2018-05-25 DIAGNOSIS — L8990 Pressure ulcer of unspecified site, unspecified stage: Secondary | ICD-10-CM | POA: Diagnosis not present

## 2018-05-25 DIAGNOSIS — G809 Cerebral palsy, unspecified: Secondary | ICD-10-CM | POA: Diagnosis not present

## 2018-05-25 DIAGNOSIS — I679 Cerebrovascular disease, unspecified: Secondary | ICD-10-CM | POA: Diagnosis not present

## 2018-05-27 DIAGNOSIS — M4800 Spinal stenosis, site unspecified: Secondary | ICD-10-CM | POA: Diagnosis not present

## 2018-05-27 DIAGNOSIS — L8990 Pressure ulcer of unspecified site, unspecified stage: Secondary | ICD-10-CM | POA: Diagnosis not present

## 2018-05-27 DIAGNOSIS — I679 Cerebrovascular disease, unspecified: Secondary | ICD-10-CM | POA: Diagnosis not present

## 2018-05-27 DIAGNOSIS — G809 Cerebral palsy, unspecified: Secondary | ICD-10-CM | POA: Diagnosis not present

## 2018-06-03 DIAGNOSIS — G809 Cerebral palsy, unspecified: Secondary | ICD-10-CM | POA: Diagnosis not present

## 2018-06-03 DIAGNOSIS — M4800 Spinal stenosis, site unspecified: Secondary | ICD-10-CM | POA: Diagnosis not present

## 2018-06-03 DIAGNOSIS — L8990 Pressure ulcer of unspecified site, unspecified stage: Secondary | ICD-10-CM | POA: Diagnosis not present

## 2018-06-03 DIAGNOSIS — I679 Cerebrovascular disease, unspecified: Secondary | ICD-10-CM | POA: Diagnosis not present

## 2018-06-10 DIAGNOSIS — I679 Cerebrovascular disease, unspecified: Secondary | ICD-10-CM | POA: Diagnosis not present

## 2018-06-10 DIAGNOSIS — M4800 Spinal stenosis, site unspecified: Secondary | ICD-10-CM | POA: Diagnosis not present

## 2018-06-10 DIAGNOSIS — L8990 Pressure ulcer of unspecified site, unspecified stage: Secondary | ICD-10-CM | POA: Diagnosis not present

## 2018-06-10 DIAGNOSIS — G809 Cerebral palsy, unspecified: Secondary | ICD-10-CM | POA: Diagnosis not present

## 2018-06-11 IMAGING — DX DG CHEST 1V PORT
1 series · 1 of 1 positions shown · non-contrast
Comparison: 06/17/2013

CLINICAL DATA: History of cerebral palsy.  Nonverbal patient.

EXAM:
PORTABLE CHEST 1 VIEW

[chest ap]
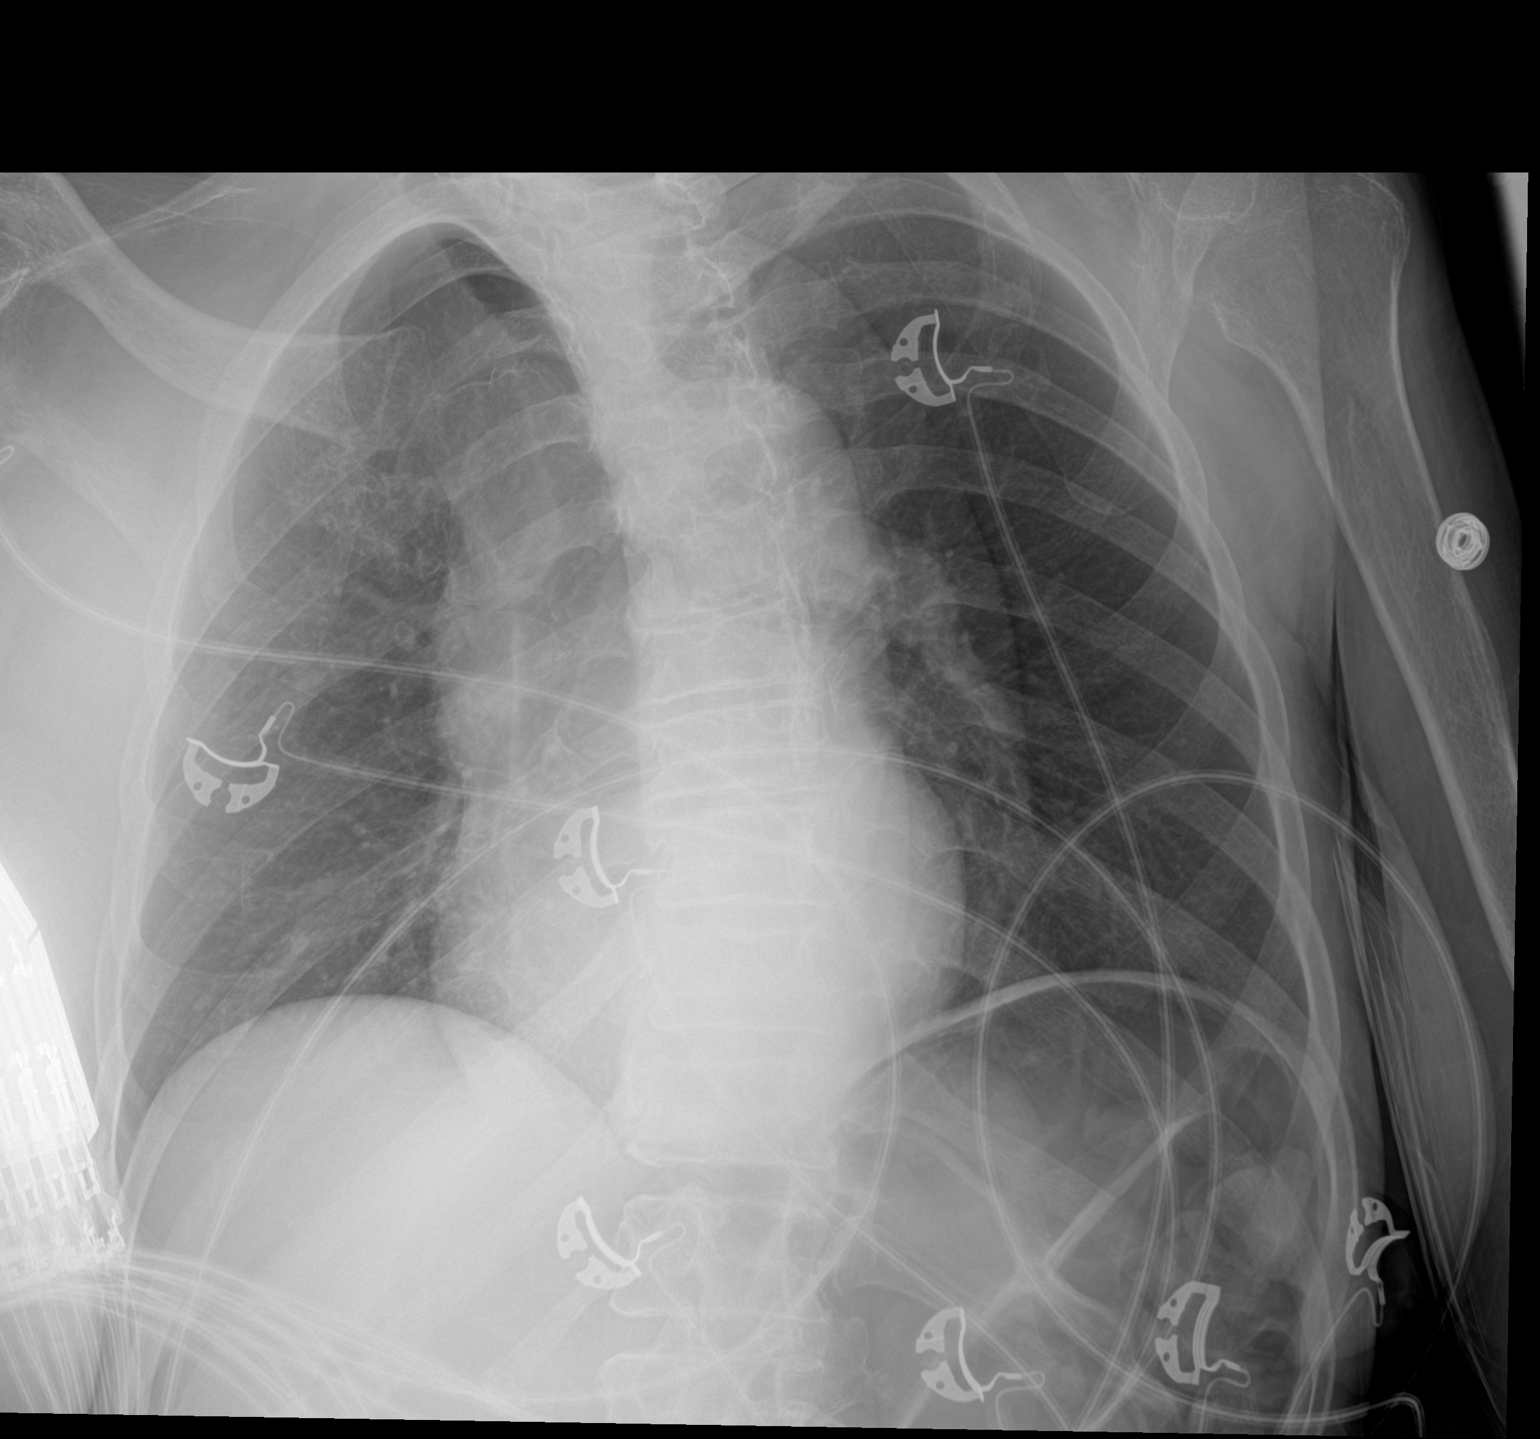

[1 of 1 positions shown; findings below may reference images not displayed]

FINDINGS: Cardiomediastinal silhouette is normal. Mediastinal contours appear
intact.

There is no evidence of focal airspace consolidation, pleural
effusion or pneumothorax. Bronchiectasis with mild peribronchial
thickening in the right upper lobe.

Osseous structures are without acute abnormality. Soft tissues are
grossly normal.
IMPRESSION: Bronchiectasis with mild peribronchial thickening in the right upper
lobe.

## 2018-06-12 DIAGNOSIS — L8990 Pressure ulcer of unspecified site, unspecified stage: Secondary | ICD-10-CM | POA: Diagnosis not present

## 2018-06-12 DIAGNOSIS — I679 Cerebrovascular disease, unspecified: Secondary | ICD-10-CM | POA: Diagnosis not present

## 2018-06-12 DIAGNOSIS — G809 Cerebral palsy, unspecified: Secondary | ICD-10-CM | POA: Diagnosis not present

## 2018-06-12 DIAGNOSIS — M4800 Spinal stenosis, site unspecified: Secondary | ICD-10-CM | POA: Diagnosis not present

## 2018-06-17 DIAGNOSIS — G809 Cerebral palsy, unspecified: Secondary | ICD-10-CM | POA: Diagnosis not present

## 2018-06-17 DIAGNOSIS — L8990 Pressure ulcer of unspecified site, unspecified stage: Secondary | ICD-10-CM | POA: Diagnosis not present

## 2018-06-17 DIAGNOSIS — I679 Cerebrovascular disease, unspecified: Secondary | ICD-10-CM | POA: Diagnosis not present

## 2018-06-17 DIAGNOSIS — M4800 Spinal stenosis, site unspecified: Secondary | ICD-10-CM | POA: Diagnosis not present

## 2018-06-22 DIAGNOSIS — L8990 Pressure ulcer of unspecified site, unspecified stage: Secondary | ICD-10-CM | POA: Diagnosis not present

## 2018-06-22 DIAGNOSIS — I679 Cerebrovascular disease, unspecified: Secondary | ICD-10-CM | POA: Diagnosis not present

## 2018-06-22 DIAGNOSIS — G809 Cerebral palsy, unspecified: Secondary | ICD-10-CM | POA: Diagnosis not present

## 2018-06-22 DIAGNOSIS — M4800 Spinal stenosis, site unspecified: Secondary | ICD-10-CM | POA: Diagnosis not present

## 2018-06-24 DIAGNOSIS — I679 Cerebrovascular disease, unspecified: Secondary | ICD-10-CM | POA: Diagnosis not present

## 2018-06-24 DIAGNOSIS — G809 Cerebral palsy, unspecified: Secondary | ICD-10-CM | POA: Diagnosis not present

## 2018-06-24 DIAGNOSIS — L8990 Pressure ulcer of unspecified site, unspecified stage: Secondary | ICD-10-CM | POA: Diagnosis not present

## 2018-06-24 DIAGNOSIS — M4800 Spinal stenosis, site unspecified: Secondary | ICD-10-CM | POA: Diagnosis not present

## 2018-07-02 DIAGNOSIS — G809 Cerebral palsy, unspecified: Secondary | ICD-10-CM | POA: Diagnosis not present

## 2018-07-02 DIAGNOSIS — M4800 Spinal stenosis, site unspecified: Secondary | ICD-10-CM | POA: Diagnosis not present

## 2018-07-02 DIAGNOSIS — L8990 Pressure ulcer of unspecified site, unspecified stage: Secondary | ICD-10-CM | POA: Diagnosis not present

## 2018-07-02 DIAGNOSIS — I679 Cerebrovascular disease, unspecified: Secondary | ICD-10-CM | POA: Diagnosis not present

## 2018-07-03 DIAGNOSIS — Z7401 Bed confinement status: Secondary | ICD-10-CM | POA: Diagnosis not present

## 2018-07-03 DIAGNOSIS — F419 Anxiety disorder, unspecified: Secondary | ICD-10-CM | POA: Diagnosis not present

## 2018-07-03 DIAGNOSIS — K5909 Other constipation: Secondary | ICD-10-CM | POA: Diagnosis not present

## 2018-07-03 DIAGNOSIS — F0390 Unspecified dementia without behavioral disturbance: Secondary | ICD-10-CM | POA: Diagnosis not present

## 2018-07-08 DIAGNOSIS — I679 Cerebrovascular disease, unspecified: Secondary | ICD-10-CM | POA: Diagnosis not present

## 2018-07-08 DIAGNOSIS — G809 Cerebral palsy, unspecified: Secondary | ICD-10-CM | POA: Diagnosis not present

## 2018-07-08 DIAGNOSIS — M4800 Spinal stenosis, site unspecified: Secondary | ICD-10-CM | POA: Diagnosis not present

## 2018-07-08 DIAGNOSIS — L8990 Pressure ulcer of unspecified site, unspecified stage: Secondary | ICD-10-CM | POA: Diagnosis not present

## 2018-07-11 DIAGNOSIS — G809 Cerebral palsy, unspecified: Secondary | ICD-10-CM | POA: Diagnosis not present

## 2018-07-11 DIAGNOSIS — L8990 Pressure ulcer of unspecified site, unspecified stage: Secondary | ICD-10-CM | POA: Diagnosis not present

## 2018-07-11 DIAGNOSIS — M4800 Spinal stenosis, site unspecified: Secondary | ICD-10-CM | POA: Diagnosis not present

## 2018-07-11 DIAGNOSIS — I679 Cerebrovascular disease, unspecified: Secondary | ICD-10-CM | POA: Diagnosis not present

## 2018-07-14 DIAGNOSIS — G809 Cerebral palsy, unspecified: Secondary | ICD-10-CM | POA: Diagnosis not present

## 2018-07-14 DIAGNOSIS — L8990 Pressure ulcer of unspecified site, unspecified stage: Secondary | ICD-10-CM | POA: Diagnosis not present

## 2018-07-14 DIAGNOSIS — M4800 Spinal stenosis, site unspecified: Secondary | ICD-10-CM | POA: Diagnosis not present

## 2018-07-14 DIAGNOSIS — I679 Cerebrovascular disease, unspecified: Secondary | ICD-10-CM | POA: Diagnosis not present

## 2018-07-16 DIAGNOSIS — M4800 Spinal stenosis, site unspecified: Secondary | ICD-10-CM | POA: Diagnosis not present

## 2018-07-16 DIAGNOSIS — I679 Cerebrovascular disease, unspecified: Secondary | ICD-10-CM | POA: Diagnosis not present

## 2018-07-16 DIAGNOSIS — G809 Cerebral palsy, unspecified: Secondary | ICD-10-CM | POA: Diagnosis not present

## 2018-07-16 DIAGNOSIS — L8990 Pressure ulcer of unspecified site, unspecified stage: Secondary | ICD-10-CM | POA: Diagnosis not present

## 2018-07-19 DIAGNOSIS — G809 Cerebral palsy, unspecified: Secondary | ICD-10-CM | POA: Diagnosis not present

## 2018-07-19 DIAGNOSIS — L8990 Pressure ulcer of unspecified site, unspecified stage: Secondary | ICD-10-CM | POA: Diagnosis not present

## 2018-07-19 DIAGNOSIS — I679 Cerebrovascular disease, unspecified: Secondary | ICD-10-CM | POA: Diagnosis not present

## 2018-07-19 DIAGNOSIS — M4800 Spinal stenosis, site unspecified: Secondary | ICD-10-CM | POA: Diagnosis not present

## 2018-07-20 DIAGNOSIS — M4800 Spinal stenosis, site unspecified: Secondary | ICD-10-CM | POA: Diagnosis not present

## 2018-07-20 DIAGNOSIS — G809 Cerebral palsy, unspecified: Secondary | ICD-10-CM | POA: Diagnosis not present

## 2018-07-20 DIAGNOSIS — L8990 Pressure ulcer of unspecified site, unspecified stage: Secondary | ICD-10-CM | POA: Diagnosis not present

## 2018-07-20 DIAGNOSIS — I679 Cerebrovascular disease, unspecified: Secondary | ICD-10-CM | POA: Diagnosis not present

## 2018-07-21 DIAGNOSIS — L8990 Pressure ulcer of unspecified site, unspecified stage: Secondary | ICD-10-CM | POA: Diagnosis not present

## 2018-07-21 DIAGNOSIS — M4800 Spinal stenosis, site unspecified: Secondary | ICD-10-CM | POA: Diagnosis not present

## 2018-07-21 DIAGNOSIS — I679 Cerebrovascular disease, unspecified: Secondary | ICD-10-CM | POA: Diagnosis not present

## 2018-07-21 DIAGNOSIS — G809 Cerebral palsy, unspecified: Secondary | ICD-10-CM | POA: Diagnosis not present

## 2018-07-22 DIAGNOSIS — I679 Cerebrovascular disease, unspecified: Secondary | ICD-10-CM | POA: Diagnosis not present

## 2018-07-22 DIAGNOSIS — G809 Cerebral palsy, unspecified: Secondary | ICD-10-CM | POA: Diagnosis not present

## 2018-07-22 DIAGNOSIS — M4800 Spinal stenosis, site unspecified: Secondary | ICD-10-CM | POA: Diagnosis not present

## 2018-07-22 DIAGNOSIS — L8990 Pressure ulcer of unspecified site, unspecified stage: Secondary | ICD-10-CM | POA: Diagnosis not present

## 2018-07-23 DIAGNOSIS — I679 Cerebrovascular disease, unspecified: Secondary | ICD-10-CM | POA: Diagnosis not present

## 2018-07-23 DIAGNOSIS — L8990 Pressure ulcer of unspecified site, unspecified stage: Secondary | ICD-10-CM | POA: Diagnosis not present

## 2018-07-23 DIAGNOSIS — G809 Cerebral palsy, unspecified: Secondary | ICD-10-CM | POA: Diagnosis not present

## 2018-07-23 DIAGNOSIS — M4800 Spinal stenosis, site unspecified: Secondary | ICD-10-CM | POA: Diagnosis not present

## 2018-07-24 DIAGNOSIS — I679 Cerebrovascular disease, unspecified: Secondary | ICD-10-CM | POA: Diagnosis not present

## 2018-07-24 DIAGNOSIS — L8990 Pressure ulcer of unspecified site, unspecified stage: Secondary | ICD-10-CM | POA: Diagnosis not present

## 2018-07-24 DIAGNOSIS — G809 Cerebral palsy, unspecified: Secondary | ICD-10-CM | POA: Diagnosis not present

## 2018-07-24 DIAGNOSIS — M4800 Spinal stenosis, site unspecified: Secondary | ICD-10-CM | POA: Diagnosis not present

## 2018-07-25 DIAGNOSIS — L8990 Pressure ulcer of unspecified site, unspecified stage: Secondary | ICD-10-CM | POA: Diagnosis not present

## 2018-07-25 DIAGNOSIS — M4800 Spinal stenosis, site unspecified: Secondary | ICD-10-CM | POA: Diagnosis not present

## 2018-07-25 DIAGNOSIS — G809 Cerebral palsy, unspecified: Secondary | ICD-10-CM | POA: Diagnosis not present

## 2018-07-25 DIAGNOSIS — I679 Cerebrovascular disease, unspecified: Secondary | ICD-10-CM | POA: Diagnosis not present

## 2018-07-28 DIAGNOSIS — G809 Cerebral palsy, unspecified: Secondary | ICD-10-CM | POA: Diagnosis not present

## 2018-07-28 DIAGNOSIS — M4800 Spinal stenosis, site unspecified: Secondary | ICD-10-CM | POA: Diagnosis not present

## 2018-07-28 DIAGNOSIS — I679 Cerebrovascular disease, unspecified: Secondary | ICD-10-CM | POA: Diagnosis not present

## 2018-07-28 DIAGNOSIS — L8990 Pressure ulcer of unspecified site, unspecified stage: Secondary | ICD-10-CM | POA: Diagnosis not present

## 2018-07-29 DIAGNOSIS — I679 Cerebrovascular disease, unspecified: Secondary | ICD-10-CM | POA: Diagnosis not present

## 2018-07-29 DIAGNOSIS — M4800 Spinal stenosis, site unspecified: Secondary | ICD-10-CM | POA: Diagnosis not present

## 2018-07-29 DIAGNOSIS — G809 Cerebral palsy, unspecified: Secondary | ICD-10-CM | POA: Diagnosis not present

## 2018-07-29 DIAGNOSIS — L8990 Pressure ulcer of unspecified site, unspecified stage: Secondary | ICD-10-CM | POA: Diagnosis not present

## 2018-08-05 DIAGNOSIS — G809 Cerebral palsy, unspecified: Secondary | ICD-10-CM | POA: Diagnosis not present

## 2018-08-05 DIAGNOSIS — M4800 Spinal stenosis, site unspecified: Secondary | ICD-10-CM | POA: Diagnosis not present

## 2018-08-05 DIAGNOSIS — I679 Cerebrovascular disease, unspecified: Secondary | ICD-10-CM | POA: Diagnosis not present

## 2018-08-05 DIAGNOSIS — L8990 Pressure ulcer of unspecified site, unspecified stage: Secondary | ICD-10-CM | POA: Diagnosis not present

## 2018-08-11 DIAGNOSIS — G809 Cerebral palsy, unspecified: Secondary | ICD-10-CM | POA: Diagnosis not present

## 2018-08-11 DIAGNOSIS — L8990 Pressure ulcer of unspecified site, unspecified stage: Secondary | ICD-10-CM | POA: Diagnosis not present

## 2018-08-11 DIAGNOSIS — M4800 Spinal stenosis, site unspecified: Secondary | ICD-10-CM | POA: Diagnosis not present

## 2018-08-11 DIAGNOSIS — I679 Cerebrovascular disease, unspecified: Secondary | ICD-10-CM | POA: Diagnosis not present

## 2018-08-12 DIAGNOSIS — G809 Cerebral palsy, unspecified: Secondary | ICD-10-CM | POA: Diagnosis not present

## 2018-08-12 DIAGNOSIS — L8990 Pressure ulcer of unspecified site, unspecified stage: Secondary | ICD-10-CM | POA: Diagnosis not present

## 2018-08-12 DIAGNOSIS — I679 Cerebrovascular disease, unspecified: Secondary | ICD-10-CM | POA: Diagnosis not present

## 2018-08-12 DIAGNOSIS — M4800 Spinal stenosis, site unspecified: Secondary | ICD-10-CM | POA: Diagnosis not present

## 2018-08-14 DIAGNOSIS — Z7401 Bed confinement status: Secondary | ICD-10-CM | POA: Diagnosis not present

## 2018-08-14 DIAGNOSIS — F5104 Psychophysiologic insomnia: Secondary | ICD-10-CM | POA: Diagnosis not present

## 2018-08-14 DIAGNOSIS — F0391 Unspecified dementia with behavioral disturbance: Secondary | ICD-10-CM | POA: Diagnosis not present

## 2018-09-10 DIAGNOSIS — R64 Cachexia: Secondary | ICD-10-CM | POA: Diagnosis not present

## 2018-09-10 DIAGNOSIS — M4800 Spinal stenosis, site unspecified: Secondary | ICD-10-CM | POA: Diagnosis not present

## 2018-09-10 DIAGNOSIS — Z7401 Bed confinement status: Secondary | ICD-10-CM | POA: Diagnosis not present

## 2018-09-10 DIAGNOSIS — G809 Cerebral palsy, unspecified: Secondary | ICD-10-CM | POA: Diagnosis not present

## 2018-09-10 DIAGNOSIS — I69318 Other symptoms and signs involving cognitive functions following cerebral infarction: Secondary | ICD-10-CM | POA: Diagnosis not present

## 2018-09-10 DIAGNOSIS — E46 Unspecified protein-calorie malnutrition: Secondary | ICD-10-CM | POA: Diagnosis not present

## 2018-09-30 DIAGNOSIS — M4800 Spinal stenosis, site unspecified: Secondary | ICD-10-CM | POA: Diagnosis not present

## 2018-09-30 DIAGNOSIS — E46 Unspecified protein-calorie malnutrition: Secondary | ICD-10-CM | POA: Diagnosis not present

## 2018-09-30 DIAGNOSIS — R64 Cachexia: Secondary | ICD-10-CM | POA: Diagnosis not present

## 2018-09-30 DIAGNOSIS — Z7401 Bed confinement status: Secondary | ICD-10-CM | POA: Diagnosis not present

## 2018-09-30 DIAGNOSIS — I69318 Other symptoms and signs involving cognitive functions following cerebral infarction: Secondary | ICD-10-CM | POA: Diagnosis not present

## 2018-09-30 DIAGNOSIS — G809 Cerebral palsy, unspecified: Secondary | ICD-10-CM | POA: Diagnosis not present

## 2018-10-11 DIAGNOSIS — M4800 Spinal stenosis, site unspecified: Secondary | ICD-10-CM | POA: Diagnosis not present

## 2018-10-11 DIAGNOSIS — I69318 Other symptoms and signs involving cognitive functions following cerebral infarction: Secondary | ICD-10-CM | POA: Diagnosis not present

## 2018-10-11 DIAGNOSIS — E46 Unspecified protein-calorie malnutrition: Secondary | ICD-10-CM | POA: Diagnosis not present

## 2018-10-11 DIAGNOSIS — R64 Cachexia: Secondary | ICD-10-CM | POA: Diagnosis not present

## 2018-10-11 DIAGNOSIS — G809 Cerebral palsy, unspecified: Secondary | ICD-10-CM | POA: Diagnosis not present

## 2018-10-11 DIAGNOSIS — Z7401 Bed confinement status: Secondary | ICD-10-CM | POA: Diagnosis not present

## 2018-10-14 DIAGNOSIS — Z7401 Bed confinement status: Secondary | ICD-10-CM | POA: Diagnosis not present

## 2018-10-14 DIAGNOSIS — G809 Cerebral palsy, unspecified: Secondary | ICD-10-CM | POA: Diagnosis not present

## 2018-10-14 DIAGNOSIS — M4800 Spinal stenosis, site unspecified: Secondary | ICD-10-CM | POA: Diagnosis not present

## 2018-10-14 DIAGNOSIS — E46 Unspecified protein-calorie malnutrition: Secondary | ICD-10-CM | POA: Diagnosis not present

## 2018-10-14 DIAGNOSIS — R64 Cachexia: Secondary | ICD-10-CM | POA: Diagnosis not present

## 2018-10-14 DIAGNOSIS — I69318 Other symptoms and signs involving cognitive functions following cerebral infarction: Secondary | ICD-10-CM | POA: Diagnosis not present

## 2018-10-28 DIAGNOSIS — I69318 Other symptoms and signs involving cognitive functions following cerebral infarction: Secondary | ICD-10-CM | POA: Diagnosis not present

## 2018-10-28 DIAGNOSIS — M4800 Spinal stenosis, site unspecified: Secondary | ICD-10-CM | POA: Diagnosis not present

## 2018-10-28 DIAGNOSIS — G809 Cerebral palsy, unspecified: Secondary | ICD-10-CM | POA: Diagnosis not present

## 2018-10-28 DIAGNOSIS — Z7401 Bed confinement status: Secondary | ICD-10-CM | POA: Diagnosis not present

## 2018-10-28 DIAGNOSIS — E46 Unspecified protein-calorie malnutrition: Secondary | ICD-10-CM | POA: Diagnosis not present

## 2018-10-28 DIAGNOSIS — R64 Cachexia: Secondary | ICD-10-CM | POA: Diagnosis not present

## 2018-11-10 DIAGNOSIS — I69318 Other symptoms and signs involving cognitive functions following cerebral infarction: Secondary | ICD-10-CM | POA: Diagnosis not present

## 2018-11-10 DIAGNOSIS — M4800 Spinal stenosis, site unspecified: Secondary | ICD-10-CM | POA: Diagnosis not present

## 2018-11-10 DIAGNOSIS — Z7401 Bed confinement status: Secondary | ICD-10-CM | POA: Diagnosis not present

## 2018-11-10 DIAGNOSIS — F0391 Unspecified dementia with behavioral disturbance: Secondary | ICD-10-CM | POA: Diagnosis not present

## 2018-11-10 DIAGNOSIS — G809 Cerebral palsy, unspecified: Secondary | ICD-10-CM | POA: Diagnosis not present

## 2018-11-10 DIAGNOSIS — E46 Unspecified protein-calorie malnutrition: Secondary | ICD-10-CM | POA: Diagnosis not present

## 2018-11-10 DIAGNOSIS — F5104 Psychophysiologic insomnia: Secondary | ICD-10-CM | POA: Diagnosis not present

## 2018-11-10 DIAGNOSIS — R64 Cachexia: Secondary | ICD-10-CM | POA: Diagnosis not present

## 2018-11-10 DIAGNOSIS — F419 Anxiety disorder, unspecified: Secondary | ICD-10-CM | POA: Diagnosis not present

## 2018-11-11 DIAGNOSIS — G809 Cerebral palsy, unspecified: Secondary | ICD-10-CM | POA: Diagnosis not present

## 2018-11-11 DIAGNOSIS — I69318 Other symptoms and signs involving cognitive functions following cerebral infarction: Secondary | ICD-10-CM | POA: Diagnosis not present

## 2018-11-11 DIAGNOSIS — E46 Unspecified protein-calorie malnutrition: Secondary | ICD-10-CM | POA: Diagnosis not present

## 2018-11-11 DIAGNOSIS — M4800 Spinal stenosis, site unspecified: Secondary | ICD-10-CM | POA: Diagnosis not present

## 2018-11-11 DIAGNOSIS — Z7401 Bed confinement status: Secondary | ICD-10-CM | POA: Diagnosis not present

## 2018-11-11 DIAGNOSIS — R64 Cachexia: Secondary | ICD-10-CM | POA: Diagnosis not present

## 2018-11-17 DIAGNOSIS — E46 Unspecified protein-calorie malnutrition: Secondary | ICD-10-CM | POA: Diagnosis not present

## 2018-11-17 DIAGNOSIS — R64 Cachexia: Secondary | ICD-10-CM | POA: Diagnosis not present

## 2018-11-17 DIAGNOSIS — M4800 Spinal stenosis, site unspecified: Secondary | ICD-10-CM | POA: Diagnosis not present

## 2018-11-17 DIAGNOSIS — G809 Cerebral palsy, unspecified: Secondary | ICD-10-CM | POA: Diagnosis not present

## 2018-11-17 DIAGNOSIS — Z7401 Bed confinement status: Secondary | ICD-10-CM | POA: Diagnosis not present

## 2018-11-17 DIAGNOSIS — I69318 Other symptoms and signs involving cognitive functions following cerebral infarction: Secondary | ICD-10-CM | POA: Diagnosis not present

## 2018-11-25 DIAGNOSIS — R64 Cachexia: Secondary | ICD-10-CM | POA: Diagnosis not present

## 2018-11-25 DIAGNOSIS — E46 Unspecified protein-calorie malnutrition: Secondary | ICD-10-CM | POA: Diagnosis not present

## 2018-11-25 DIAGNOSIS — Z7401 Bed confinement status: Secondary | ICD-10-CM | POA: Diagnosis not present

## 2018-11-25 DIAGNOSIS — G809 Cerebral palsy, unspecified: Secondary | ICD-10-CM | POA: Diagnosis not present

## 2018-11-25 DIAGNOSIS — I69318 Other symptoms and signs involving cognitive functions following cerebral infarction: Secondary | ICD-10-CM | POA: Diagnosis not present

## 2018-11-25 DIAGNOSIS — M4800 Spinal stenosis, site unspecified: Secondary | ICD-10-CM | POA: Diagnosis not present

## 2018-12-09 DIAGNOSIS — I69318 Other symptoms and signs involving cognitive functions following cerebral infarction: Secondary | ICD-10-CM | POA: Diagnosis not present

## 2018-12-09 DIAGNOSIS — E46 Unspecified protein-calorie malnutrition: Secondary | ICD-10-CM | POA: Diagnosis not present

## 2018-12-09 DIAGNOSIS — G809 Cerebral palsy, unspecified: Secondary | ICD-10-CM | POA: Diagnosis not present

## 2018-12-09 DIAGNOSIS — M4800 Spinal stenosis, site unspecified: Secondary | ICD-10-CM | POA: Diagnosis not present

## 2018-12-09 DIAGNOSIS — R64 Cachexia: Secondary | ICD-10-CM | POA: Diagnosis not present

## 2018-12-09 DIAGNOSIS — Z7401 Bed confinement status: Secondary | ICD-10-CM | POA: Diagnosis not present

## 2018-12-11 DIAGNOSIS — R64 Cachexia: Secondary | ICD-10-CM | POA: Diagnosis not present

## 2018-12-11 DIAGNOSIS — E46 Unspecified protein-calorie malnutrition: Secondary | ICD-10-CM | POA: Diagnosis not present

## 2018-12-11 DIAGNOSIS — I69318 Other symptoms and signs involving cognitive functions following cerebral infarction: Secondary | ICD-10-CM | POA: Diagnosis not present

## 2018-12-11 DIAGNOSIS — Z7401 Bed confinement status: Secondary | ICD-10-CM | POA: Diagnosis not present

## 2018-12-11 DIAGNOSIS — M4800 Spinal stenosis, site unspecified: Secondary | ICD-10-CM | POA: Diagnosis not present

## 2018-12-11 DIAGNOSIS — G809 Cerebral palsy, unspecified: Secondary | ICD-10-CM | POA: Diagnosis not present

## 2018-12-16 DIAGNOSIS — R64 Cachexia: Secondary | ICD-10-CM | POA: Diagnosis not present

## 2018-12-16 DIAGNOSIS — G809 Cerebral palsy, unspecified: Secondary | ICD-10-CM | POA: Diagnosis not present

## 2018-12-16 DIAGNOSIS — M4800 Spinal stenosis, site unspecified: Secondary | ICD-10-CM | POA: Diagnosis not present

## 2018-12-16 DIAGNOSIS — E46 Unspecified protein-calorie malnutrition: Secondary | ICD-10-CM | POA: Diagnosis not present

## 2018-12-16 DIAGNOSIS — Z7401 Bed confinement status: Secondary | ICD-10-CM | POA: Diagnosis not present

## 2018-12-16 DIAGNOSIS — I69318 Other symptoms and signs involving cognitive functions following cerebral infarction: Secondary | ICD-10-CM | POA: Diagnosis not present

## 2018-12-17 DIAGNOSIS — I69318 Other symptoms and signs involving cognitive functions following cerebral infarction: Secondary | ICD-10-CM | POA: Diagnosis not present

## 2018-12-17 DIAGNOSIS — G809 Cerebral palsy, unspecified: Secondary | ICD-10-CM | POA: Diagnosis not present

## 2018-12-17 DIAGNOSIS — E46 Unspecified protein-calorie malnutrition: Secondary | ICD-10-CM | POA: Diagnosis not present

## 2018-12-17 DIAGNOSIS — Z7401 Bed confinement status: Secondary | ICD-10-CM | POA: Diagnosis not present

## 2018-12-17 DIAGNOSIS — M4800 Spinal stenosis, site unspecified: Secondary | ICD-10-CM | POA: Diagnosis not present

## 2018-12-17 DIAGNOSIS — R64 Cachexia: Secondary | ICD-10-CM | POA: Diagnosis not present

## 2018-12-23 DIAGNOSIS — R64 Cachexia: Secondary | ICD-10-CM | POA: Diagnosis not present

## 2018-12-23 DIAGNOSIS — M4800 Spinal stenosis, site unspecified: Secondary | ICD-10-CM | POA: Diagnosis not present

## 2018-12-23 DIAGNOSIS — I69318 Other symptoms and signs involving cognitive functions following cerebral infarction: Secondary | ICD-10-CM | POA: Diagnosis not present

## 2018-12-23 DIAGNOSIS — G809 Cerebral palsy, unspecified: Secondary | ICD-10-CM | POA: Diagnosis not present

## 2018-12-23 DIAGNOSIS — E46 Unspecified protein-calorie malnutrition: Secondary | ICD-10-CM | POA: Diagnosis not present

## 2018-12-23 DIAGNOSIS — Z7401 Bed confinement status: Secondary | ICD-10-CM | POA: Diagnosis not present

## 2018-12-30 DIAGNOSIS — R64 Cachexia: Secondary | ICD-10-CM | POA: Diagnosis not present

## 2018-12-30 DIAGNOSIS — M4800 Spinal stenosis, site unspecified: Secondary | ICD-10-CM | POA: Diagnosis not present

## 2018-12-30 DIAGNOSIS — E46 Unspecified protein-calorie malnutrition: Secondary | ICD-10-CM | POA: Diagnosis not present

## 2018-12-30 DIAGNOSIS — Z7401 Bed confinement status: Secondary | ICD-10-CM | POA: Diagnosis not present

## 2018-12-30 DIAGNOSIS — G809 Cerebral palsy, unspecified: Secondary | ICD-10-CM | POA: Diagnosis not present

## 2018-12-30 DIAGNOSIS — I69318 Other symptoms and signs involving cognitive functions following cerebral infarction: Secondary | ICD-10-CM | POA: Diagnosis not present

## 2019-01-07 DIAGNOSIS — E46 Unspecified protein-calorie malnutrition: Secondary | ICD-10-CM | POA: Diagnosis not present

## 2019-01-07 DIAGNOSIS — G809 Cerebral palsy, unspecified: Secondary | ICD-10-CM | POA: Diagnosis not present

## 2019-01-07 DIAGNOSIS — M4800 Spinal stenosis, site unspecified: Secondary | ICD-10-CM | POA: Diagnosis not present

## 2019-01-07 DIAGNOSIS — I69318 Other symptoms and signs involving cognitive functions following cerebral infarction: Secondary | ICD-10-CM | POA: Diagnosis not present

## 2019-01-07 DIAGNOSIS — R64 Cachexia: Secondary | ICD-10-CM | POA: Diagnosis not present

## 2019-01-07 DIAGNOSIS — Z7401 Bed confinement status: Secondary | ICD-10-CM | POA: Diagnosis not present

## 2019-01-11 DIAGNOSIS — G809 Cerebral palsy, unspecified: Secondary | ICD-10-CM | POA: Diagnosis not present

## 2019-01-11 DIAGNOSIS — E46 Unspecified protein-calorie malnutrition: Secondary | ICD-10-CM | POA: Diagnosis not present

## 2019-01-11 DIAGNOSIS — I69318 Other symptoms and signs involving cognitive functions following cerebral infarction: Secondary | ICD-10-CM | POA: Diagnosis not present

## 2019-01-11 DIAGNOSIS — M4800 Spinal stenosis, site unspecified: Secondary | ICD-10-CM | POA: Diagnosis not present

## 2019-01-11 DIAGNOSIS — R64 Cachexia: Secondary | ICD-10-CM | POA: Diagnosis not present

## 2019-01-11 DIAGNOSIS — Z7401 Bed confinement status: Secondary | ICD-10-CM | POA: Diagnosis not present

## 2019-01-12 DIAGNOSIS — I69318 Other symptoms and signs involving cognitive functions following cerebral infarction: Secondary | ICD-10-CM | POA: Diagnosis not present

## 2019-01-12 DIAGNOSIS — Z7401 Bed confinement status: Secondary | ICD-10-CM | POA: Diagnosis not present

## 2019-01-12 DIAGNOSIS — R64 Cachexia: Secondary | ICD-10-CM | POA: Diagnosis not present

## 2019-01-12 DIAGNOSIS — M4800 Spinal stenosis, site unspecified: Secondary | ICD-10-CM | POA: Diagnosis not present

## 2019-01-12 DIAGNOSIS — E46 Unspecified protein-calorie malnutrition: Secondary | ICD-10-CM | POA: Diagnosis not present

## 2019-01-12 DIAGNOSIS — G809 Cerebral palsy, unspecified: Secondary | ICD-10-CM | POA: Diagnosis not present

## 2019-01-20 DIAGNOSIS — M4800 Spinal stenosis, site unspecified: Secondary | ICD-10-CM | POA: Diagnosis not present

## 2019-01-20 DIAGNOSIS — I69318 Other symptoms and signs involving cognitive functions following cerebral infarction: Secondary | ICD-10-CM | POA: Diagnosis not present

## 2019-01-20 DIAGNOSIS — E46 Unspecified protein-calorie malnutrition: Secondary | ICD-10-CM | POA: Diagnosis not present

## 2019-01-20 DIAGNOSIS — G809 Cerebral palsy, unspecified: Secondary | ICD-10-CM | POA: Diagnosis not present

## 2019-01-20 DIAGNOSIS — Z7401 Bed confinement status: Secondary | ICD-10-CM | POA: Diagnosis not present

## 2019-01-20 DIAGNOSIS — R64 Cachexia: Secondary | ICD-10-CM | POA: Diagnosis not present

## 2019-01-27 DIAGNOSIS — E46 Unspecified protein-calorie malnutrition: Secondary | ICD-10-CM | POA: Diagnosis not present

## 2019-01-27 DIAGNOSIS — G809 Cerebral palsy, unspecified: Secondary | ICD-10-CM | POA: Diagnosis not present

## 2019-01-27 DIAGNOSIS — Z7401 Bed confinement status: Secondary | ICD-10-CM | POA: Diagnosis not present

## 2019-01-27 DIAGNOSIS — M4800 Spinal stenosis, site unspecified: Secondary | ICD-10-CM | POA: Diagnosis not present

## 2019-01-27 DIAGNOSIS — R64 Cachexia: Secondary | ICD-10-CM | POA: Diagnosis not present

## 2019-01-27 DIAGNOSIS — I69318 Other symptoms and signs involving cognitive functions following cerebral infarction: Secondary | ICD-10-CM | POA: Diagnosis not present

## 2019-02-03 DIAGNOSIS — I69318 Other symptoms and signs involving cognitive functions following cerebral infarction: Secondary | ICD-10-CM | POA: Diagnosis not present

## 2019-02-03 DIAGNOSIS — Z7401 Bed confinement status: Secondary | ICD-10-CM | POA: Diagnosis not present

## 2019-02-03 DIAGNOSIS — E46 Unspecified protein-calorie malnutrition: Secondary | ICD-10-CM | POA: Diagnosis not present

## 2019-02-03 DIAGNOSIS — G809 Cerebral palsy, unspecified: Secondary | ICD-10-CM | POA: Diagnosis not present

## 2019-02-03 DIAGNOSIS — R64 Cachexia: Secondary | ICD-10-CM | POA: Diagnosis not present

## 2019-02-03 DIAGNOSIS — M4800 Spinal stenosis, site unspecified: Secondary | ICD-10-CM | POA: Diagnosis not present

## 2019-02-09 DIAGNOSIS — M4800 Spinal stenosis, site unspecified: Secondary | ICD-10-CM | POA: Diagnosis not present

## 2019-02-09 DIAGNOSIS — F0391 Unspecified dementia with behavioral disturbance: Secondary | ICD-10-CM | POA: Diagnosis not present

## 2019-02-09 DIAGNOSIS — Z7401 Bed confinement status: Secondary | ICD-10-CM | POA: Diagnosis not present

## 2019-02-09 DIAGNOSIS — R64 Cachexia: Secondary | ICD-10-CM | POA: Diagnosis not present

## 2019-02-09 DIAGNOSIS — E46 Unspecified protein-calorie malnutrition: Secondary | ICD-10-CM | POA: Diagnosis not present

## 2019-02-09 DIAGNOSIS — F419 Anxiety disorder, unspecified: Secondary | ICD-10-CM | POA: Diagnosis not present

## 2019-02-09 DIAGNOSIS — I69318 Other symptoms and signs involving cognitive functions following cerebral infarction: Secondary | ICD-10-CM | POA: Diagnosis not present

## 2019-02-09 DIAGNOSIS — G809 Cerebral palsy, unspecified: Secondary | ICD-10-CM | POA: Diagnosis not present

## 2019-02-10 DIAGNOSIS — E46 Unspecified protein-calorie malnutrition: Secondary | ICD-10-CM | POA: Diagnosis not present

## 2019-02-10 DIAGNOSIS — M4800 Spinal stenosis, site unspecified: Secondary | ICD-10-CM | POA: Diagnosis not present

## 2019-02-10 DIAGNOSIS — R64 Cachexia: Secondary | ICD-10-CM | POA: Diagnosis not present

## 2019-02-10 DIAGNOSIS — I69318 Other symptoms and signs involving cognitive functions following cerebral infarction: Secondary | ICD-10-CM | POA: Diagnosis not present

## 2019-02-10 DIAGNOSIS — G809 Cerebral palsy, unspecified: Secondary | ICD-10-CM | POA: Diagnosis not present

## 2019-02-10 DIAGNOSIS — Z7401 Bed confinement status: Secondary | ICD-10-CM | POA: Diagnosis not present

## 2019-02-12 DIAGNOSIS — Z7401 Bed confinement status: Secondary | ICD-10-CM | POA: Diagnosis not present

## 2019-02-12 DIAGNOSIS — R64 Cachexia: Secondary | ICD-10-CM | POA: Diagnosis not present

## 2019-02-12 DIAGNOSIS — M4800 Spinal stenosis, site unspecified: Secondary | ICD-10-CM | POA: Diagnosis not present

## 2019-02-12 DIAGNOSIS — E46 Unspecified protein-calorie malnutrition: Secondary | ICD-10-CM | POA: Diagnosis not present

## 2019-02-12 DIAGNOSIS — I69318 Other symptoms and signs involving cognitive functions following cerebral infarction: Secondary | ICD-10-CM | POA: Diagnosis not present

## 2019-02-12 DIAGNOSIS — G809 Cerebral palsy, unspecified: Secondary | ICD-10-CM | POA: Diagnosis not present

## 2019-02-16 DIAGNOSIS — M4800 Spinal stenosis, site unspecified: Secondary | ICD-10-CM | POA: Diagnosis not present

## 2019-02-16 DIAGNOSIS — R64 Cachexia: Secondary | ICD-10-CM | POA: Diagnosis not present

## 2019-02-16 DIAGNOSIS — G809 Cerebral palsy, unspecified: Secondary | ICD-10-CM | POA: Diagnosis not present

## 2019-02-16 DIAGNOSIS — Z7401 Bed confinement status: Secondary | ICD-10-CM | POA: Diagnosis not present

## 2019-02-16 DIAGNOSIS — I69318 Other symptoms and signs involving cognitive functions following cerebral infarction: Secondary | ICD-10-CM | POA: Diagnosis not present

## 2019-02-16 DIAGNOSIS — E46 Unspecified protein-calorie malnutrition: Secondary | ICD-10-CM | POA: Diagnosis not present

## 2019-02-23 DIAGNOSIS — M4800 Spinal stenosis, site unspecified: Secondary | ICD-10-CM | POA: Diagnosis not present

## 2019-02-23 DIAGNOSIS — E46 Unspecified protein-calorie malnutrition: Secondary | ICD-10-CM | POA: Diagnosis not present

## 2019-02-23 DIAGNOSIS — G809 Cerebral palsy, unspecified: Secondary | ICD-10-CM | POA: Diagnosis not present

## 2019-02-23 DIAGNOSIS — R64 Cachexia: Secondary | ICD-10-CM | POA: Diagnosis not present

## 2019-02-23 DIAGNOSIS — Z7401 Bed confinement status: Secondary | ICD-10-CM | POA: Diagnosis not present

## 2019-02-23 DIAGNOSIS — I69318 Other symptoms and signs involving cognitive functions following cerebral infarction: Secondary | ICD-10-CM | POA: Diagnosis not present

## 2019-03-01 DIAGNOSIS — E46 Unspecified protein-calorie malnutrition: Secondary | ICD-10-CM | POA: Diagnosis not present

## 2019-03-01 DIAGNOSIS — I69318 Other symptoms and signs involving cognitive functions following cerebral infarction: Secondary | ICD-10-CM | POA: Diagnosis not present

## 2019-03-01 DIAGNOSIS — R64 Cachexia: Secondary | ICD-10-CM | POA: Diagnosis not present

## 2019-03-01 DIAGNOSIS — G809 Cerebral palsy, unspecified: Secondary | ICD-10-CM | POA: Diagnosis not present

## 2019-03-01 DIAGNOSIS — Z7401 Bed confinement status: Secondary | ICD-10-CM | POA: Diagnosis not present

## 2019-03-01 DIAGNOSIS — M4800 Spinal stenosis, site unspecified: Secondary | ICD-10-CM | POA: Diagnosis not present

## 2019-03-02 DIAGNOSIS — G809 Cerebral palsy, unspecified: Secondary | ICD-10-CM | POA: Diagnosis not present

## 2019-03-02 DIAGNOSIS — R64 Cachexia: Secondary | ICD-10-CM | POA: Diagnosis not present

## 2019-03-02 DIAGNOSIS — E46 Unspecified protein-calorie malnutrition: Secondary | ICD-10-CM | POA: Diagnosis not present

## 2019-03-02 DIAGNOSIS — Z7401 Bed confinement status: Secondary | ICD-10-CM | POA: Diagnosis not present

## 2019-03-02 DIAGNOSIS — M4800 Spinal stenosis, site unspecified: Secondary | ICD-10-CM | POA: Diagnosis not present

## 2019-03-02 DIAGNOSIS — I69318 Other symptoms and signs involving cognitive functions following cerebral infarction: Secondary | ICD-10-CM | POA: Diagnosis not present

## 2019-03-08 DIAGNOSIS — Z7401 Bed confinement status: Secondary | ICD-10-CM | POA: Diagnosis not present

## 2019-03-08 DIAGNOSIS — M4800 Spinal stenosis, site unspecified: Secondary | ICD-10-CM | POA: Diagnosis not present

## 2019-03-08 DIAGNOSIS — R64 Cachexia: Secondary | ICD-10-CM | POA: Diagnosis not present

## 2019-03-08 DIAGNOSIS — I69318 Other symptoms and signs involving cognitive functions following cerebral infarction: Secondary | ICD-10-CM | POA: Diagnosis not present

## 2019-03-08 DIAGNOSIS — E46 Unspecified protein-calorie malnutrition: Secondary | ICD-10-CM | POA: Diagnosis not present

## 2019-03-08 DIAGNOSIS — G809 Cerebral palsy, unspecified: Secondary | ICD-10-CM | POA: Diagnosis not present

## 2019-03-09 DIAGNOSIS — Z7401 Bed confinement status: Secondary | ICD-10-CM | POA: Diagnosis not present

## 2019-03-09 DIAGNOSIS — G809 Cerebral palsy, unspecified: Secondary | ICD-10-CM | POA: Diagnosis not present

## 2019-03-09 DIAGNOSIS — I69318 Other symptoms and signs involving cognitive functions following cerebral infarction: Secondary | ICD-10-CM | POA: Diagnosis not present

## 2019-03-09 DIAGNOSIS — M4800 Spinal stenosis, site unspecified: Secondary | ICD-10-CM | POA: Diagnosis not present

## 2019-03-09 DIAGNOSIS — E46 Unspecified protein-calorie malnutrition: Secondary | ICD-10-CM | POA: Diagnosis not present

## 2019-03-09 DIAGNOSIS — R64 Cachexia: Secondary | ICD-10-CM | POA: Diagnosis not present

## 2019-03-13 DIAGNOSIS — G809 Cerebral palsy, unspecified: Secondary | ICD-10-CM | POA: Diagnosis not present

## 2019-03-13 DIAGNOSIS — E46 Unspecified protein-calorie malnutrition: Secondary | ICD-10-CM | POA: Diagnosis not present

## 2019-03-13 DIAGNOSIS — Z7401 Bed confinement status: Secondary | ICD-10-CM | POA: Diagnosis not present

## 2019-03-13 DIAGNOSIS — M4800 Spinal stenosis, site unspecified: Secondary | ICD-10-CM | POA: Diagnosis not present

## 2019-03-13 DIAGNOSIS — I69318 Other symptoms and signs involving cognitive functions following cerebral infarction: Secondary | ICD-10-CM | POA: Diagnosis not present

## 2019-03-13 DIAGNOSIS — L89302 Pressure ulcer of unspecified buttock, stage 2: Secondary | ICD-10-CM | POA: Diagnosis not present

## 2019-03-13 DIAGNOSIS — R64 Cachexia: Secondary | ICD-10-CM | POA: Diagnosis not present

## 2019-03-16 DIAGNOSIS — I69318 Other symptoms and signs involving cognitive functions following cerebral infarction: Secondary | ICD-10-CM | POA: Diagnosis not present

## 2019-03-16 DIAGNOSIS — M4800 Spinal stenosis, site unspecified: Secondary | ICD-10-CM | POA: Diagnosis not present

## 2019-03-16 DIAGNOSIS — R64 Cachexia: Secondary | ICD-10-CM | POA: Diagnosis not present

## 2019-03-16 DIAGNOSIS — E46 Unspecified protein-calorie malnutrition: Secondary | ICD-10-CM | POA: Diagnosis not present

## 2019-03-16 DIAGNOSIS — G809 Cerebral palsy, unspecified: Secondary | ICD-10-CM | POA: Diagnosis not present

## 2019-03-16 DIAGNOSIS — L89302 Pressure ulcer of unspecified buttock, stage 2: Secondary | ICD-10-CM | POA: Diagnosis not present

## 2019-03-23 DIAGNOSIS — I69318 Other symptoms and signs involving cognitive functions following cerebral infarction: Secondary | ICD-10-CM | POA: Diagnosis not present

## 2019-03-23 DIAGNOSIS — R64 Cachexia: Secondary | ICD-10-CM | POA: Diagnosis not present

## 2019-03-23 DIAGNOSIS — G809 Cerebral palsy, unspecified: Secondary | ICD-10-CM | POA: Diagnosis not present

## 2019-03-23 DIAGNOSIS — M4800 Spinal stenosis, site unspecified: Secondary | ICD-10-CM | POA: Diagnosis not present

## 2019-03-23 DIAGNOSIS — L89302 Pressure ulcer of unspecified buttock, stage 2: Secondary | ICD-10-CM | POA: Diagnosis not present

## 2019-03-23 DIAGNOSIS — E46 Unspecified protein-calorie malnutrition: Secondary | ICD-10-CM | POA: Diagnosis not present

## 2019-03-25 DIAGNOSIS — M4800 Spinal stenosis, site unspecified: Secondary | ICD-10-CM | POA: Diagnosis not present

## 2019-03-25 DIAGNOSIS — R64 Cachexia: Secondary | ICD-10-CM | POA: Diagnosis not present

## 2019-03-25 DIAGNOSIS — G809 Cerebral palsy, unspecified: Secondary | ICD-10-CM | POA: Diagnosis not present

## 2019-03-25 DIAGNOSIS — I69318 Other symptoms and signs involving cognitive functions following cerebral infarction: Secondary | ICD-10-CM | POA: Diagnosis not present

## 2019-03-25 DIAGNOSIS — E46 Unspecified protein-calorie malnutrition: Secondary | ICD-10-CM | POA: Diagnosis not present

## 2019-03-25 DIAGNOSIS — L89302 Pressure ulcer of unspecified buttock, stage 2: Secondary | ICD-10-CM | POA: Diagnosis not present

## 2019-03-29 DIAGNOSIS — M4800 Spinal stenosis, site unspecified: Secondary | ICD-10-CM | POA: Diagnosis not present

## 2019-03-29 DIAGNOSIS — G809 Cerebral palsy, unspecified: Secondary | ICD-10-CM | POA: Diagnosis not present

## 2019-03-29 DIAGNOSIS — L89302 Pressure ulcer of unspecified buttock, stage 2: Secondary | ICD-10-CM | POA: Diagnosis not present

## 2019-03-29 DIAGNOSIS — E46 Unspecified protein-calorie malnutrition: Secondary | ICD-10-CM | POA: Diagnosis not present

## 2019-03-29 DIAGNOSIS — I69318 Other symptoms and signs involving cognitive functions following cerebral infarction: Secondary | ICD-10-CM | POA: Diagnosis not present

## 2019-03-29 DIAGNOSIS — R64 Cachexia: Secondary | ICD-10-CM | POA: Diagnosis not present

## 2019-03-30 DIAGNOSIS — R64 Cachexia: Secondary | ICD-10-CM | POA: Diagnosis not present

## 2019-03-30 DIAGNOSIS — M4800 Spinal stenosis, site unspecified: Secondary | ICD-10-CM | POA: Diagnosis not present

## 2019-03-30 DIAGNOSIS — G809 Cerebral palsy, unspecified: Secondary | ICD-10-CM | POA: Diagnosis not present

## 2019-03-30 DIAGNOSIS — L89302 Pressure ulcer of unspecified buttock, stage 2: Secondary | ICD-10-CM | POA: Diagnosis not present

## 2019-03-30 DIAGNOSIS — E46 Unspecified protein-calorie malnutrition: Secondary | ICD-10-CM | POA: Diagnosis not present

## 2019-03-30 DIAGNOSIS — I69318 Other symptoms and signs involving cognitive functions following cerebral infarction: Secondary | ICD-10-CM | POA: Diagnosis not present

## 2019-04-06 DIAGNOSIS — E46 Unspecified protein-calorie malnutrition: Secondary | ICD-10-CM | POA: Diagnosis not present

## 2019-04-06 DIAGNOSIS — M4800 Spinal stenosis, site unspecified: Secondary | ICD-10-CM | POA: Diagnosis not present

## 2019-04-06 DIAGNOSIS — G809 Cerebral palsy, unspecified: Secondary | ICD-10-CM | POA: Diagnosis not present

## 2019-04-06 DIAGNOSIS — R64 Cachexia: Secondary | ICD-10-CM | POA: Diagnosis not present

## 2019-04-06 DIAGNOSIS — I69318 Other symptoms and signs involving cognitive functions following cerebral infarction: Secondary | ICD-10-CM | POA: Diagnosis not present

## 2019-04-06 DIAGNOSIS — L89302 Pressure ulcer of unspecified buttock, stage 2: Secondary | ICD-10-CM | POA: Diagnosis not present

## 2019-06-13 DEATH — deceased
